# Patient Record
Sex: Male | Born: 1955 | Race: White | Hispanic: No | Smoking: Current every day smoker
Health system: Southern US, Community
[De-identification: ages and names within clinical notes are randomized; demographics above are authoritative.]

## PROBLEM LIST (undated history)

## (undated) DIAGNOSIS — C787 Secondary malignant neoplasm of liver and intrahepatic bile duct: Secondary | ICD-10-CM

## (undated) DIAGNOSIS — K219 Gastro-esophageal reflux disease without esophagitis: Secondary | ICD-10-CM

## (undated) DIAGNOSIS — C801 Malignant (primary) neoplasm, unspecified: Secondary | ICD-10-CM

## (undated) DIAGNOSIS — M109 Gout, unspecified: Secondary | ICD-10-CM

## (undated) DIAGNOSIS — C259 Malignant neoplasm of pancreas, unspecified: Secondary | ICD-10-CM

## (undated) HISTORY — PX: NO PAST SURGERIES: SHX2092

---

## 2002-03-04 ENCOUNTER — Emergency Department (HOSPITAL_COMMUNITY): Admission: EM | Admit: 2002-03-04 | Discharge: 2002-03-04 | Payer: Self-pay | Admitting: Emergency Medicine

## 2002-03-07 ENCOUNTER — Emergency Department (HOSPITAL_COMMUNITY): Admission: EM | Admit: 2002-03-07 | Discharge: 2002-03-07 | Payer: Self-pay | Admitting: Emergency Medicine

## 2005-01-09 ENCOUNTER — Emergency Department (HOSPITAL_COMMUNITY): Admission: EM | Admit: 2005-01-09 | Discharge: 2005-01-09 | Payer: Self-pay | Admitting: Emergency Medicine

## 2005-01-11 ENCOUNTER — Emergency Department (HOSPITAL_COMMUNITY): Admission: EM | Admit: 2005-01-11 | Discharge: 2005-01-11 | Payer: Self-pay | Admitting: Emergency Medicine

## 2011-06-29 ENCOUNTER — Emergency Department (HOSPITAL_COMMUNITY): Payer: Self-pay

## 2011-06-29 ENCOUNTER — Emergency Department (HOSPITAL_COMMUNITY)
Admission: EM | Admit: 2011-06-29 | Discharge: 2011-06-29 | Disposition: A | Discharge status: Home Health | Payer: Self-pay | Attending: Emergency Medicine | Admitting: Emergency Medicine

## 2011-06-29 ENCOUNTER — Encounter (HOSPITAL_COMMUNITY): Payer: Self-pay | Admitting: *Deleted

## 2011-06-29 DIAGNOSIS — S02609A Fracture of mandible, unspecified, initial encounter for closed fracture: Secondary | ICD-10-CM | POA: Insufficient documentation

## 2011-06-29 DIAGNOSIS — R6884 Jaw pain: Secondary | ICD-10-CM | POA: Insufficient documentation

## 2011-06-29 DIAGNOSIS — M25429 Effusion, unspecified elbow: Secondary | ICD-10-CM | POA: Insufficient documentation

## 2011-06-29 DIAGNOSIS — IMO0002 Reserved for concepts with insufficient information to code with codable children: Secondary | ICD-10-CM | POA: Insufficient documentation

## 2011-06-29 DIAGNOSIS — M25569 Pain in unspecified knee: Secondary | ICD-10-CM | POA: Insufficient documentation

## 2011-06-29 DIAGNOSIS — Y9355 Activity, bike riding: Secondary | ICD-10-CM | POA: Insufficient documentation

## 2011-06-29 DIAGNOSIS — S52123A Displaced fracture of head of unspecified radius, initial encounter for closed fracture: Secondary | ICD-10-CM | POA: Insufficient documentation

## 2011-06-29 MED ORDER — OXYCODONE-ACETAMINOPHEN 5-325 MG PO TABS: 2.0000 | ORAL_TABLET | ORAL | Status: AC | PRN

## 2011-06-29 MED ORDER — HYDROCODONE-ACETAMINOPHEN 5-325 MG PO TABS
2.0000 | ORAL_TABLET | Freq: Once | ORAL | Status: AC
  Administered 2011-06-29: 2 via ORAL
  Filled 2011-06-29: qty 2
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
## 2011-06-29 NOTE — ED Notes (Signed)
Dr Rosen at bedside 

## 2011-06-29 NOTE — ED Notes (Signed)
Report given to Jacklyn Shell, RN. Pt. Will be waiting for Dr. Pollyann Kennedy.

## 2011-06-29 NOTE — Progress Notes (Signed)
Orthopedic Tech Progress Note Patient Details:  Chad Arnold 12/23/1955 [id number]  Other Ortho Devices Type of Ortho Device: Other (comment) (arm sling) Ortho Device Location: (R) UE Ortho Device Interventions: Application   Jennye Moccasin 06/29/2011, 4:05 PM
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
## 2012-03-03 ENCOUNTER — Encounter (HOSPITAL_COMMUNITY): Payer: Self-pay | Admitting: Emergency Medicine

## 2012-03-03 ENCOUNTER — Emergency Department (HOSPITAL_COMMUNITY)
Admission: EM | Admit: 2012-03-03 | Discharge: 2012-03-03 | Disposition: A | Discharge status: Home / Self Care | Payer: Self-pay | Attending: Emergency Medicine | Admitting: Emergency Medicine

## 2012-03-03 DIAGNOSIS — F17200 Nicotine dependence, unspecified, uncomplicated: Secondary | ICD-10-CM | POA: Insufficient documentation

## 2012-03-03 DIAGNOSIS — M79609 Pain in unspecified limb: Secondary | ICD-10-CM | POA: Insufficient documentation

## 2012-03-03 DIAGNOSIS — M79606 Pain in leg, unspecified: Secondary | ICD-10-CM

## 2012-03-03 MED ORDER — KETOROLAC TROMETHAMINE 30 MG/ML IJ SOLN
30.0000 mg | Freq: Once | INTRAMUSCULAR | Status: AC
  Administered 2012-03-03: 30 mg via INTRAMUSCULAR
  Filled 2012-03-03: qty 1

## 2012-03-03 MED ORDER — TRAMADOL HCL 50 MG PO TABS: 50.0000 mg | ORAL_TABLET | Freq: Four times a day (QID) | ORAL | Status: DC | PRN
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
## 2012-03-03 NOTE — ED Provider Notes (Signed)
History   This chart was scribed for [name], MD by Gerlean Ren. This patient was seen in room TR10C/TR10C and the patient's care was started at 11:00 PM .   CSN: [id number]  Arrival date & time 03/03/12  2000   First MD Initiated Contact with Patient 03/03/12 2242      Chief Complaint  Patient presents with  . Leg Pain    (Consider location/radiation/quality/duration/timing/severity/associated sxs/prior treatment) The history is provided by the patient. No language interpreter was used.   Chad Arnold is a 56 y.o. male who presents to the Emergency Department complaining of constant, gradually worsening, non-radiating burning right lower extremity pain over front of right thigh with gradual onset after raking yard 4 days ago that is worsened by ambulation and not improved by OCM.  Pt denies any pain over posterior side of right thigh, numbness, and tingling associated.  History reviewed. No pertinent past medical history.  History reviewed. No pertinent past surgical history.  No family history on file.  History  Substance Use Topics  . Smoking status: Current Every Day Smoker  . Smokeless tobacco: Not on file  . Alcohol Use: Yes      Review of Systems  Constitutional:       Per HPI, otherwise negative  HENT:       Per HPI, otherwise negative  Eyes: Negative.   Respiratory:       Per HPI, otherwise negative  Cardiovascular:       Per HPI, otherwise negative  Gastrointestinal: Negative for vomiting.  Genitourinary: Negative.   Musculoskeletal:       Per HPI, otherwise negative Right leg pain.  Skin: Negative.   Neurological: Negative for syncope.    Allergies  Review of patient's allergies indicates no known allergies.  Home Medications  No current outpatient prescriptions on file.  BP 176/94  Pulse 109  Temp 97.5 F (36.4 C) (Oral)  Resp 18  SpO2 97%  Physical Exam  Nursing note and vitals reviewed. Constitutional: He is oriented to  person, place, and time. He appears well-developed. No distress.  HENT:  Head: Normocephalic and atraumatic.  Eyes: Conjunctivae normal and EOM are normal.  Cardiovascular: Normal rate and regular rhythm.        Symmetric distal pulses bilaterally.  Pulmonary/Chest: Effort normal. No stridor. No respiratory distress.  Abdominal: He exhibits no distension.  Musculoskeletal: He exhibits no edema.       Right knee stable. No joint line tenderness medially or laterally.  Patella stable.  Pelvis stable.   Neurological: He is alert and oriented to person, place, and time.  Skin: Skin is warm and dry.  Psychiatric: He has a normal mood and affect.    ED Course  Procedures (including critical care time) DIAGNOSTIC STUDIES: Oxygen Saturation is 97% on room air, adequate by my interpretation.    COORDINATION OF CARE: 11:04PM- Patient informed of clinical course, understands medical decision-making process, and agrees with plan. Ordered IM toradol.     Labs Reviewed - No data to display No results found.   No diagnosis found.    MDM  I personally performed the services described in this documentation, which was scribed in my presence. The recorded information has been reviewed and considered.  This male presents with ongoing right thigh pain.  On exam he is in no distress.  Please stable, and there is no evidence of infection, and little suspicion of more acute pathology such as fasciitis.  Given the patient's  endorsement of significant exertion pertinent for the symptoms are suggestive of muscle strain versus sprain.  [name], MD 03/04/12 [phone]

## 2012-03-21 ENCOUNTER — Encounter (HOSPITAL_COMMUNITY): Payer: Self-pay | Admitting: Emergency Medicine

## 2012-03-21 ENCOUNTER — Emergency Department (HOSPITAL_COMMUNITY)
Admission: EM | Admit: 2012-03-21 | Discharge: 2012-03-21 | Disposition: A | Discharge status: Home / Self Care | Payer: Self-pay | Attending: Emergency Medicine | Admitting: Emergency Medicine

## 2012-03-21 ENCOUNTER — Emergency Department (HOSPITAL_COMMUNITY): Payer: Self-pay

## 2012-03-21 DIAGNOSIS — M5430 Sciatica, unspecified side: Secondary | ICD-10-CM | POA: Insufficient documentation

## 2012-03-21 DIAGNOSIS — F17200 Nicotine dependence, unspecified, uncomplicated: Secondary | ICD-10-CM | POA: Insufficient documentation

## 2012-03-21 MED ORDER — HYDROCODONE-ACETAMINOPHEN 5-325 MG PO TABS: 1.0000 | ORAL_TABLET | Freq: Four times a day (QID) | ORAL | Status: DC | PRN

## 2012-03-21 MED ORDER — NAPROXEN 500 MG PO TBEC: 500.0000 mg | DELAYED_RELEASE_TABLET | Freq: Two times a day (BID) | ORAL | Status: DC

## 2012-03-21 MED ORDER — OXYCODONE-ACETAMINOPHEN 5-325 MG PO TABS
1.0000 | ORAL_TABLET | Freq: Once | ORAL | Status: AC
  Administered 2012-03-21: 1 via ORAL
  Filled 2012-03-21: qty 1

## 2012-03-21 MED ORDER — CYCLOBENZAPRINE HCL 5 MG PO TABS: 5.0000 mg | ORAL_TABLET | Freq: Three times a day (TID) | ORAL | Status: DC | PRN

## 2012-03-21 NOTE — ED Notes (Signed)
Patient transported to X-ray 

## 2012-03-21 NOTE — ED Provider Notes (Signed)
History    CSN: [id number] Arrival date & time 03/21/12  1443 First MD Initiated Contact with Patient 03/21/12 1457   Chief complaint:  Back and leg pain    HPI Pt has been having pain in his lower back and thigh now since the beginning of November.  It started after he had been raking in the yard.  Since that time the pain has persisted and now he is having pain in his lower back.  Initially it was more in the anterior part of his right thigh.  He came to the ED and was given medications but the symptoms have not gotten better and now the back is involved. No incontinence.  No numbness or weakness.  Pain increases with movement including bending over and trying to sit up straight.  History reviewed. No pertinent past medical history.  History reviewed. No pertinent past surgical history.  No family history on file.  History  Substance Use Topics  . Smoking status: Current Every Day Smoker  . Smokeless tobacco: Not on file  . Alcohol Use: Yes      Review of Systems  All other systems reviewed and are negative.    Allergies  Review of patient's allergies indicates no known allergies.  Home Medications   Current Outpatient Rx  Name  Route  Sig  Dispense  Refill  . ASPIRIN 325 MG PO TABS   Oral   Take 650 mg by mouth 2 (two) times daily as needed. For pain         . TRAMADOL HCL 50 MG PO TABS   Oral   Take 1 tablet (50 mg total) by mouth every 6 (six) hours as needed for pain.   15 tablet   0     BP 140/78  Pulse 82  Temp 97.5 F (36.4 C)  Resp 16  SpO2 99%  Physical Exam  Nursing note and vitals reviewed. Constitutional: He appears well-developed and well-nourished. No distress.  HENT:  Head: Normocephalic and atraumatic.  Right Ear: External ear normal.  Left Ear: External ear normal.  Nose: Nose normal.  Eyes: Conjunctivae normal and EOM are normal. Right eye exhibits no discharge. Left eye exhibits no discharge. No scleral icterus.  Neck: Neck  supple. No tracheal deviation present.  Cardiovascular: Normal rate.   Pulmonary/Chest: Effort normal. No stridor. No respiratory distress.  Musculoskeletal: He exhibits tenderness. He exhibits no edema.       Lumbar back: He exhibits decreased range of motion, tenderness, pain and spasm. He exhibits no swelling and no edema.       ttp anterior thigh,  Neurological: He is alert. He is not disoriented. No sensory deficit. Cranial nerve deficit: no gross deficits. He exhibits normal muscle tone. Coordination normal.  Reflex Scores:      Patellar reflexes are 2+ on the right side and 2+ on the left side.      Achilles reflexes are 2+ on the right side and 2+ on the left side. Skin: Skin is warm and dry. No rash noted. He is not diaphoretic. No erythema.  Psychiatric: He has a normal mood and affect. His behavior is normal. Thought content normal.    ED Course  Procedures (including critical care time)  Labs Reviewed - No data to display Dg Lumbar Spine Complete  03/21/2012  *RADIOLOGY REPORT*  Clinical Data: Pain in low back radiates to head on the right  LUMBAR SPINE - COMPLETE 4+ VIEW  Comparison: None.  Findings: Bones are  demineralized.  No evidence for compression fracture.  Loss of disc height is seen at L5-S1.  Convex rightward lumbar scoliosis is evident with apex at L3 level.  SI joints are normal.  IMPRESSION: No acute bony findings.   Original Report Authenticated By: Kennith Center, M.D.      1. Sciatica       MDM  No sign of acute neurological or vascular emergency associated with pt's back pain.  May have a component of sciatica.  Safe for outpatient follow up.         Celene Kras, MD 03/21/12 [phone]

## 2012-03-21 NOTE — ED Notes (Signed)
Still having back pain was sen here on 11/4 for same

## 2012-03-21 NOTE — ED Notes (Signed)
Patient is alert and orientedx4.  Patient was explained discharge instructions and he understood them with no questions. Patient's nephew is coming to transport him home.
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
## 2013-10-01 IMAGING — CR DG LUMBAR SPINE COMPLETE 4+V
5 series · 5 of 5 positions shown · non-contrast
Comparison: None.

CLINICAL DATA: Pain in low back radiates to head on the right

LUMBAR SPINE - COMPLETE 4+ VIEW

[t l-spine a.p.]
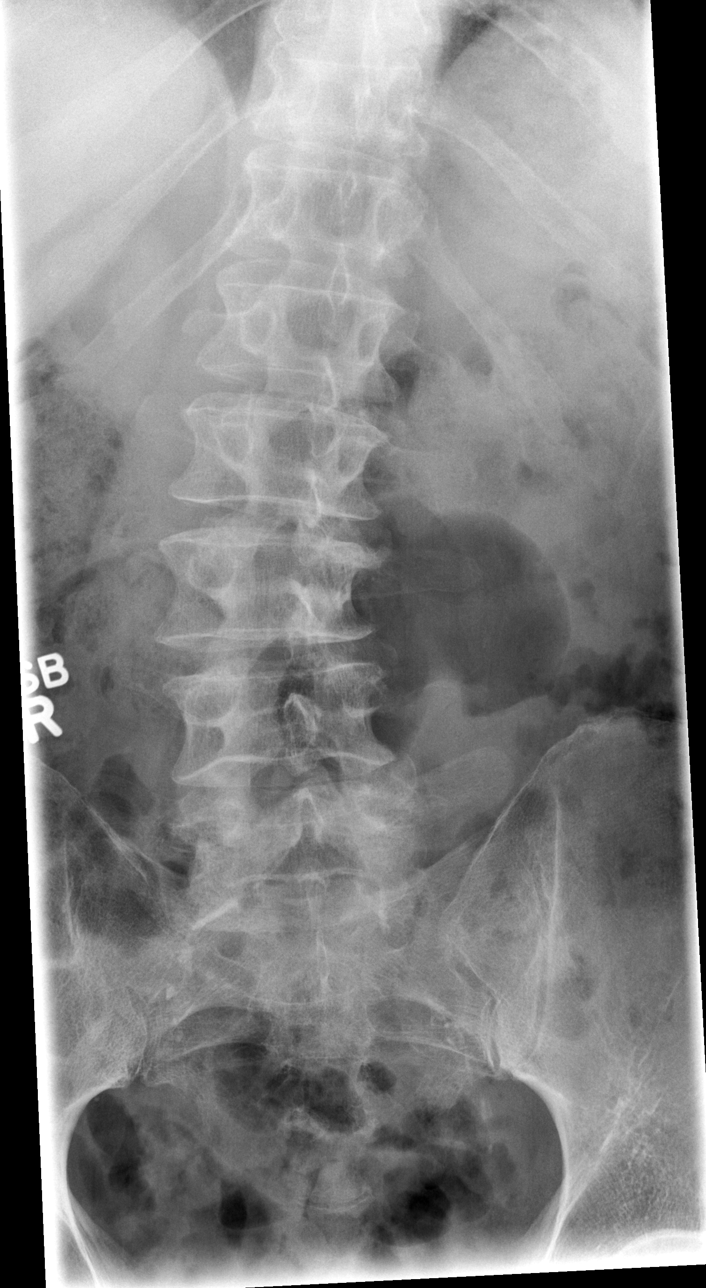

[t l-spine oblique exposure (1 of 2)]
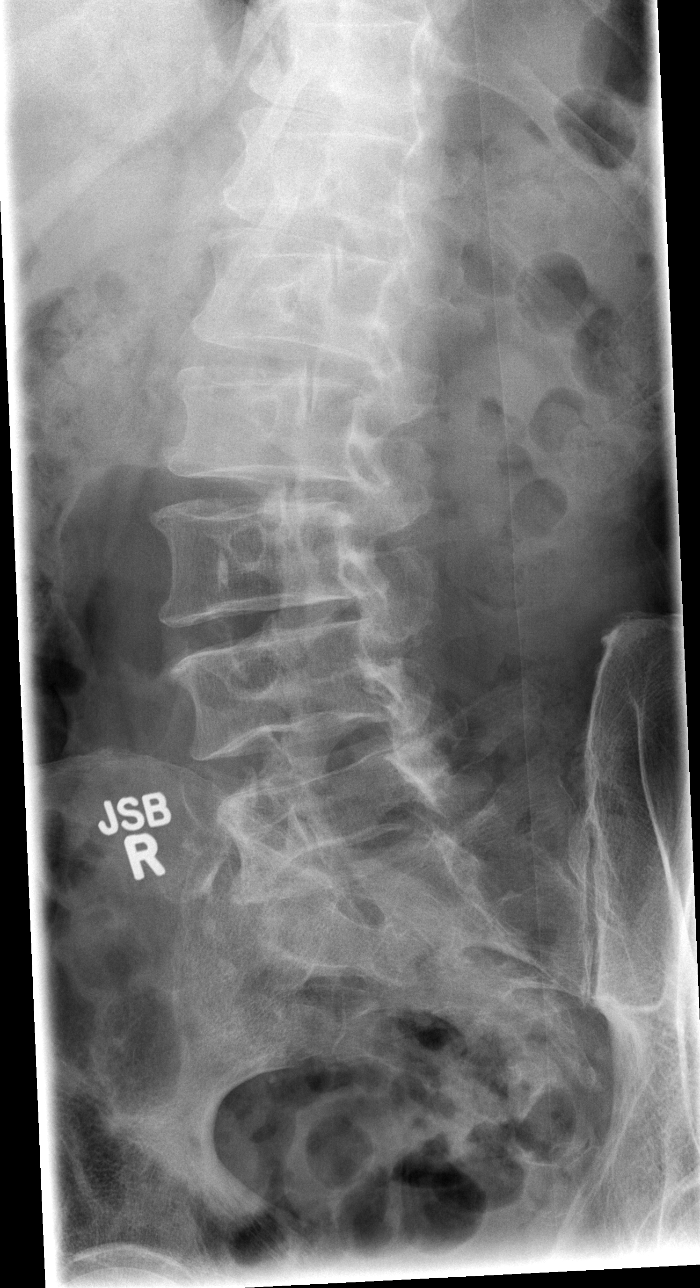

[t l-spine oblique exposure (2 of 2)]
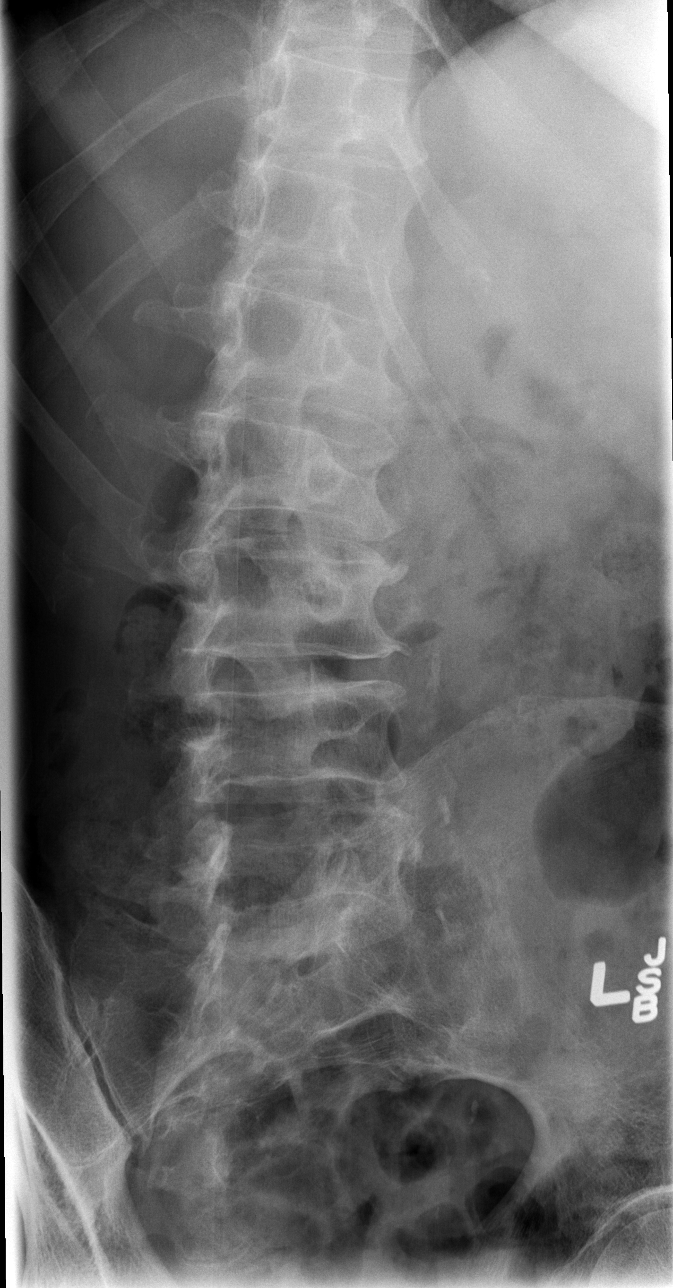

[t l-spine lat]
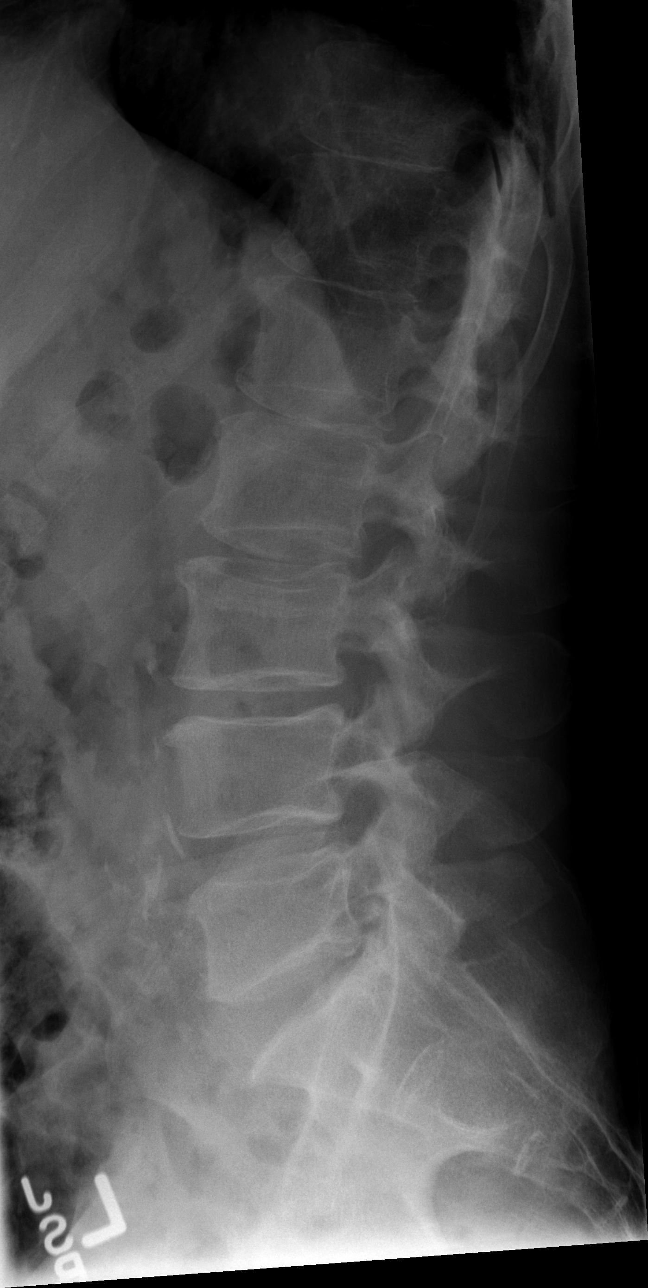

[t l-spine l5-s1 spot]
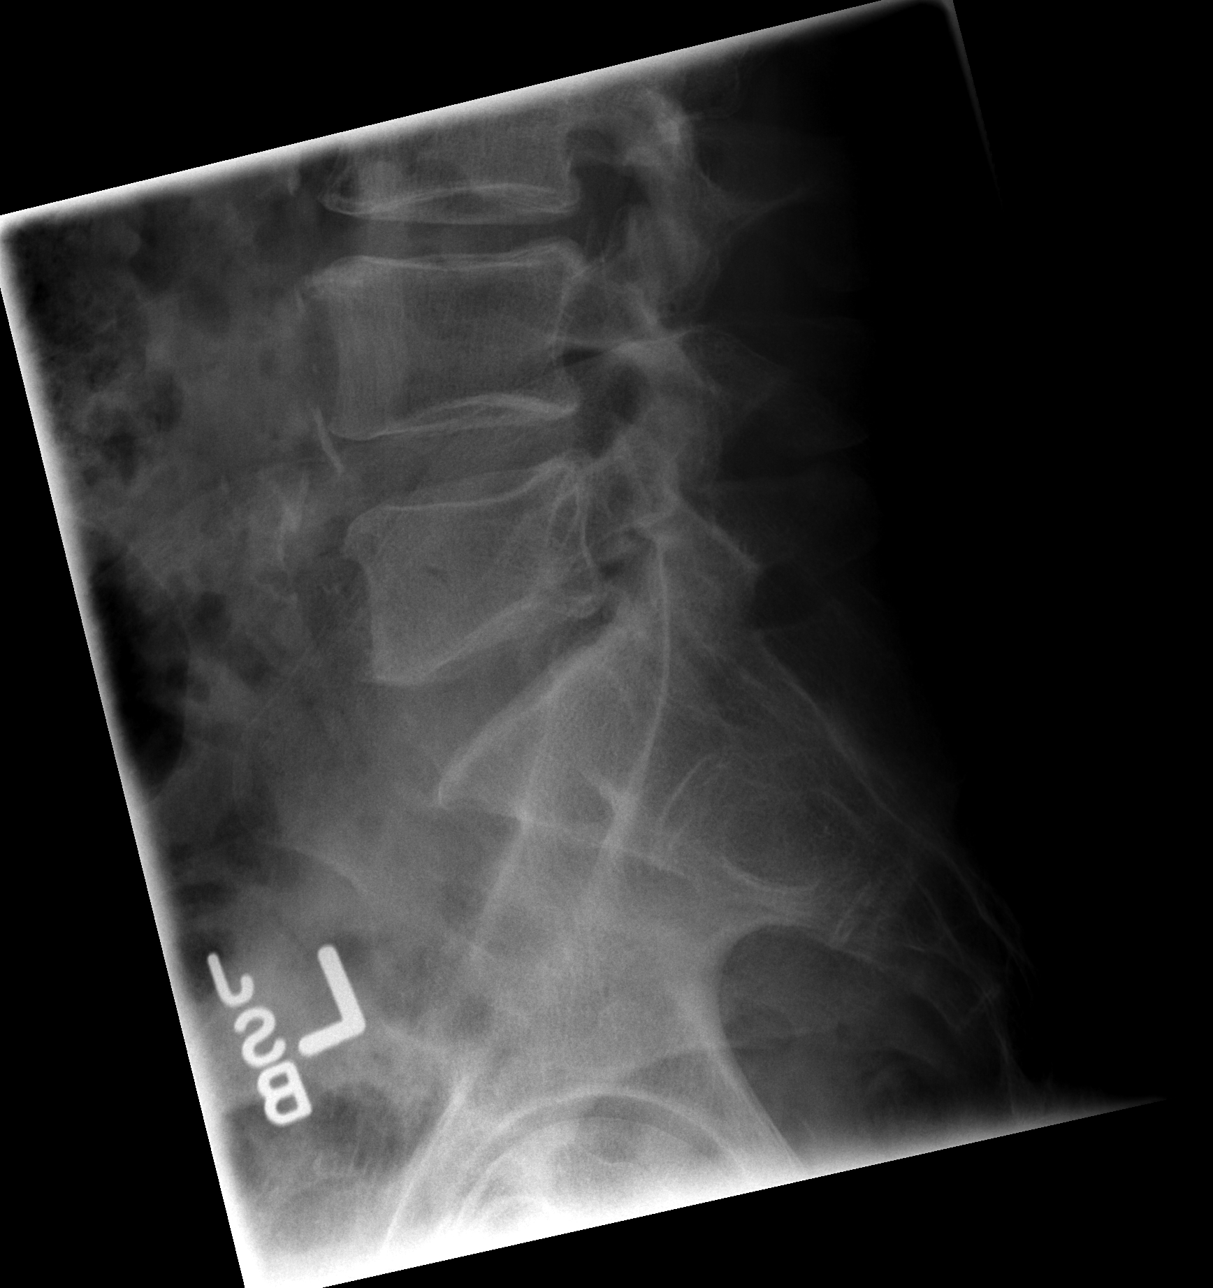

[5 of 5 positions shown; findings below may reference images not displayed]

FINDINGS: Bones are demineralized.  No evidence for compression
fracture.  Loss of disc height is seen at L5-S1.  Convex rightward
lumbar scoliosis is evident with apex at L3 level.  SI joints are
normal.
IMPRESSION: No acute bony findings.

## 2015-09-27 ENCOUNTER — Encounter (HOSPITAL_COMMUNITY): Payer: Self-pay | Admitting: Emergency Medicine

## 2015-09-27 ENCOUNTER — Emergency Department (HOSPITAL_COMMUNITY)
Admission: EM | Admit: 2015-09-27 | Discharge: 2015-09-28 | Disposition: A | Discharge status: Home / Self Care | Payer: Self-pay | Attending: Emergency Medicine | Admitting: Emergency Medicine

## 2015-09-27 DIAGNOSIS — T310 Burns involving less than 10% of body surface: Secondary | ICD-10-CM | POA: Insufficient documentation

## 2015-09-27 DIAGNOSIS — T24232A Burn of second degree of left lower leg, initial encounter: Secondary | ICD-10-CM | POA: Insufficient documentation

## 2015-09-27 DIAGNOSIS — Z79899 Other long term (current) drug therapy: Secondary | ICD-10-CM | POA: Insufficient documentation

## 2015-09-27 DIAGNOSIS — Z7982 Long term (current) use of aspirin: Secondary | ICD-10-CM | POA: Insufficient documentation

## 2015-09-27 DIAGNOSIS — Y939 Activity, unspecified: Secondary | ICD-10-CM | POA: Insufficient documentation

## 2015-09-27 DIAGNOSIS — Y92009 Unspecified place in unspecified non-institutional (private) residence as the place of occurrence of the external cause: Secondary | ICD-10-CM | POA: Insufficient documentation

## 2015-09-27 DIAGNOSIS — X088XXA Exposure to other specified smoke, fire and flames, initial encounter: Secondary | ICD-10-CM | POA: Insufficient documentation

## 2015-09-27 DIAGNOSIS — F17200 Nicotine dependence, unspecified, uncomplicated: Secondary | ICD-10-CM | POA: Insufficient documentation

## 2015-09-27 DIAGNOSIS — Z23 Encounter for immunization: Secondary | ICD-10-CM | POA: Insufficient documentation

## 2015-09-27 DIAGNOSIS — T24202A Burn of second degree of unspecified site of left lower limb, except ankle and foot, initial encounter: Secondary | ICD-10-CM

## 2015-09-27 DIAGNOSIS — Y999 Unspecified external cause status: Secondary | ICD-10-CM | POA: Insufficient documentation

## 2015-09-27 MED ORDER — TETANUS-DIPHTH-ACELL PERTUSSIS 5-2.5-18.5 LF-MCG/0.5 IM SUSP
0.5000 mL | Freq: Once | INTRAMUSCULAR | Status: AC
  Administered 2015-09-28: 0.5 mL via INTRAMUSCULAR
  Filled 2015-09-27: qty 0.5
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
## 2015-09-28 MED ORDER — BACITRACIN 500 UNIT/GM EX OINT
1.0000 "application " | TOPICAL_OINTMENT | Freq: Two times a day (BID) | CUTANEOUS | Status: DC
  Administered 2015-09-28: 1 via TOPICAL
  Filled 2015-09-28: qty 1.8
  Filled 2015-09-28: qty 0.9

## 2015-09-28 MED ORDER — BACITRACIN ZINC 500 UNIT/GM EX OINT: 1.0000 "application " | TOPICAL_OINTMENT | Freq: Two times a day (BID) | CUTANEOUS | Status: DC

## 2015-09-28 MED ORDER — NAPROXEN 250 MG PO TABS: 250.0000 mg | ORAL_TABLET | Freq: Two times a day (BID) | ORAL | Status: DC

## 2015-09-28 NOTE — Discharge Instructions (Signed)
Second-Degree Burn A second-degree burn affects the 2 outer layers of skin. The outer layer (epidermis) and the layer underneath it (dermis) are both burned. Another name for this type of burn is a partial thickness burn. A second-degree burn may be called minor or major. This depends on the size of the burn. It also depends on what parts of the skin are burned. Minor burns may be treated with first aid. Major burns are a medical emergency. A second-degree burn is worse than a first-degree burn, but not as bad as a third-degree burn. A first-degree burn affects only the epidermis. A third-degree burn goes through all the layers of skin. A second-degree burn usually heals in 3 to 4 weeks. A minor second-degree burn usually does not leave a scar.Deeper second-degree burns may lead to scarring of the skin or contractures over joints.Contractures are scars that form over joints and may lead to reduced mobility at those joints. CAUSES  Heat (thermal) injury. This happens when skin comes in contact with something very hot. It could be a flame, a hot object, hot liquid, or steam. Most second-degree burns are thermal injuries.  Radiation. Sunlight is one type of radiation that can burn the skin. Another type of radiation is used to heat food. Radiation is also used to treat some diseases, such as cancer. All types of radiation can burn the skin. Sunlight usually causes a first-degree burn. Radiation used for heating food or treating a disease can cause a second-degree burn.  Electricity. Electrical burns can cause more damage under the skin than on the surface. They should always be treated as major burns.  Chemicals. Many chemicals can burn the skin. The burn should be flushed with cool water and checked by an emergency caregiver. SYMPTOMS Symptoms of second-degree burns include:  Severe pain.  Extreme tenderness.  Deep redness.  Blistered skin.  Skin that has changed color.It might look blotchy,  wet, or shiny.  Swelling. TREATMENT Some second-degree burns may need to be treated in a hospital. These include major burns, electrical burns, and chemical burns. Many other second-degree burns can be treated with regular first aid, such as:  Cooling the burn. Use cool, germ-free (sterile) salt water. Place the burned area of skin into a tub of water, or cover the burned area with clean, wet towels.  Taking pain medicine.  Removing the dead skin from broken blisters. A trained caregiver may do this. Do not pop blisters.  Gently washing your skin with mild soap.  Covering the burned area with a cream.Silver sulfadiazine is a cream for burns. An antibiotic cream, such as bacitracin, may also be used to fight infection. Do not use other ointments or creams unless your caregiver says it is okay.  Protecting the burn with a sterile, non-sticky bandage.  Bandaging fingers and toes separately. This keeps them from sticking together.  Taking an antibiotic. This can help prevent infection.  Getting a tetanus shot. HOME CARE INSTRUCTIONS Medication  Take any medicine prescribed by your caregiver. Follow the directions carefully.  Ask your caregiver if you can take over-the-counter medicine to relieve pain and swelling. Do not give aspirin to children.  Make sure your caregiver knows about all other medicines you take.This includes over-the-counter medicines. Burn care  You will need to change the bandage on your burn. You may need to do this 2 or 3 times each day.  Gently clean the burned area.  Put ointment on it.  Cover the burn with a sterile bandage.  For some deeper burns or burns that cover a large area, compression garments may be prescribed. These garments can help minimize scarring and protect your mobility.  Do not put butter or oil on your skin. Use only the cream prescribed by your caregiver.  Do not put ice on your burn.  Do not break blisters on your  skin.  Keep the bandaged area dry. You might need to take a sponge bath for awhile.Ask your caregiver when you can take a shower or a tub bath again.  Do not scratch an itchy burn. Your caregiver may give you medicine to relieve very bad itching.  Infection is a big danger after a second-degree burn. Tell your caregiver right away if you have signs of infection, such as:  Redness or changing color in the burned area.  Fluid leaking from the burn.  Swelling in the burn area.  A bad smell coming from the wound. Follow-up  Keep all follow-up appointments.This is important. This is how your caregiver can tell if your treatment is working.  Protect your burn from sunlight.Use sunscreen whenever you go outside.Burned areas may be sensitive to the sun for up to 1 year. Exposure to the sun may also cause permanent darkening of scars. SEEK MEDICAL CARE IF:  You have any questions about medicines.  You have any questions about your treatment.  You wonder if it is okay to do a particular activity.  You develop a fever of more than 100.5 F (38.1 C). SEEK IMMEDIATE MEDICAL CARE IF:  You think your burn might be infected. It may change color, become red, leak fluid, swell, or smell bad.  You develop a fever of more than 102 F (38.9 C).   This information is not intended to replace advice given to you by your health care provider. Make sure you discuss any questions you have with your health care provider.   Document Released: 09/18/2010 Document Revised: 07/09/2011 Document Reviewed: 09/18/2010 Elsevier Interactive Patient Education 2016 [employer] Your skin is a natural barrier to infection. It is the largest organ of your body. Burns damage this natural protection. To help prevent infection, it is very important to follow your caregiver's instructions in the care of your burn. Burns are classified as:  First degree. There is only redness of the skin (erythema).  No scarring is expected.  Second degree. There is blistering of the skin. Scarring may occur with deeper burns.  Third degree. All layers of the skin are injured, and scarring is expected. HOME CARE INSTRUCTIONS   Wash your hands well before changing your bandage.  Change your bandage as often as directed by your caregiver.  Remove the old bandage. If the bandage sticks, you may soak it off with cool, clean water.  Cleanse the burn thoroughly but gently with mild soap and water.  Pat the area dry with a clean, dry cloth.  Apply a thin layer of antibacterial cream to the burn.  Apply a clean bandage as instructed by your caregiver.  Keep the bandage as clean and dry as possible.  Elevate the affected area for the first 24 hours, then as instructed by your caregiver.  Only take over-the-counter or prescription medicines for pain, discomfort, or fever as directed by your caregiver. SEEK IMMEDIATE MEDICAL CARE IF:   You develop excessive pain.  You develop redness, tenderness, swelling, or red streaks near the burn.  The burned area develops yellowish-white fluid (pus) or a bad smell.  You  have a fever. MAKE SURE YOU:   Understand these instructions.  Will watch your condition.  Will get help right away if you are not doing well or get worse.   This information is not intended to replace advice given to you by your health care provider. Make sure you discuss any questions you have with your health care provider.   Document Released: 04/16/2005 Document Revised: 07/09/2011 Document Reviewed: 09/06/2010 Elsevier Interactive Patient Education [employer].

## 2016-02-10 ENCOUNTER — Encounter (HOSPITAL_COMMUNITY): Payer: Self-pay | Admitting: Emergency Medicine

## 2016-02-10 ENCOUNTER — Emergency Department (HOSPITAL_COMMUNITY)
Admission: EM | Admit: 2016-02-10 | Discharge: 2016-02-11 | Disposition: A | Discharge status: Home / Self Care | Payer: Medicaid Other | Attending: Emergency Medicine | Admitting: Emergency Medicine

## 2016-02-10 ENCOUNTER — Emergency Department (HOSPITAL_COMMUNITY): Payer: Medicaid Other

## 2016-02-10 DIAGNOSIS — Z791 Long term (current) use of non-steroidal anti-inflammatories (NSAID): Secondary | ICD-10-CM | POA: Diagnosis not present

## 2016-02-10 DIAGNOSIS — R1084 Generalized abdominal pain: Secondary | ICD-10-CM | POA: Insufficient documentation

## 2016-02-10 DIAGNOSIS — R109 Unspecified abdominal pain: Secondary | ICD-10-CM | POA: Diagnosis present

## 2016-02-10 DIAGNOSIS — F17200 Nicotine dependence, unspecified, uncomplicated: Secondary | ICD-10-CM | POA: Insufficient documentation

## 2016-02-10 HISTORY — DX: Gastro-esophageal reflux disease without esophagitis: K21.9

## 2016-02-10 LAB — URINALYSIS, ROUTINE W REFLEX MICROSCOPIC
BILIRUBIN URINE: NEGATIVE
GLUCOSE, UA: NEGATIVE mg/dL
HGB URINE DIPSTICK: NEGATIVE
Ketones, ur: NEGATIVE mg/dL
Leukocytes, UA: NEGATIVE
NITRITE: NEGATIVE
PH: 6.5 (ref 5.0–8.0)
Protein, ur: NEGATIVE mg/dL
SPECIFIC GRAVITY, URINE: 1.012 (ref 1.005–1.030)

## 2016-02-10 LAB — COMPREHENSIVE METABOLIC PANEL
ALBUMIN: 4.2 g/dL (ref 3.5–5.0)
ALT: 20 U/L (ref 17–63)
ANION GAP: 10 (ref 5–15)
AST: 25 U/L (ref 15–41)
Alkaline Phosphatase: 76 U/L (ref 38–126)
BUN: 9 mg/dL (ref 6–20)
CHLORIDE: 96 mmol/L — AB (ref 101–111)
CO2: 25 mmol/L (ref 22–32)
Calcium: 9.6 mg/dL (ref 8.9–10.3)
Creatinine, Ser: 0.73 mg/dL (ref 0.61–1.24)
GFR calc Af Amer: >60 mL/min (ref >60)
GFR calc non Af Amer: >60 mL/min (ref >60)
GLUCOSE: 95 mg/dL (ref 65–99)
POTASSIUM: 4.4 mmol/L (ref 3.5–5.1)
SODIUM: 131 mmol/L — AB (ref 135–145)
Total Bilirubin: 0.6 mg/dL (ref 0.3–1.2)
Total Protein: 7.8 g/dL (ref 6.5–8.1)

## 2016-02-10 LAB — CBC
HEMATOCRIT: 42.4 % (ref 39.0–52.0)
HEMOGLOBIN: 14.8 g/dL (ref 13.0–17.0)
MCH: 34.4 pg — AB (ref 26.0–34.0)
MCHC: 34.9 g/dL (ref 30.0–36.0)
MCV: 98.6 fL (ref 78.0–100.0)
Platelets: 217 10*3/uL (ref 150–400)
RBC: 4.3 MIL/uL (ref 4.22–5.81)
RDW: 13.5 % (ref 11.5–15.5)
WBC: 8.2 10*3/uL (ref 4.0–10.5)

## 2016-02-10 LAB — LIPASE, BLOOD: Lipase: 77 U/L — ABNORMAL HIGH (ref 11–51)

## 2016-02-10 MED ORDER — IOPAMIDOL (ISOVUE-300) INJECTION 61%
100.0000 mL | Freq: Once | INTRAVENOUS | Status: AC | PRN
  Administered 2016-02-10: 100 mL via INTRAVENOUS

## 2016-02-10 MED ORDER — MORPHINE SULFATE (PF) 2 MG/ML IV SOLN
2.0000 mg | Freq: Once | INTRAVENOUS | Status: AC
  Administered 2016-02-10: 2 mg via INTRAVENOUS
  Filled 2016-02-10: qty 1

## 2016-02-10 MED ORDER — MORPHINE SULFATE (PF) 4 MG/ML IV SOLN
4.0000 mg | Freq: Once | INTRAVENOUS | Status: AC
  Administered 2016-02-10: 4 mg via INTRAVENOUS
  Filled 2016-02-10: qty 1

## 2016-02-10 NOTE — ED Provider Notes (Signed)
9:00 PM: Assumed care from [facility], PA-C.   Chad Arnold is a 60 y.o. male with h/o GERD presents to ED with several months history of intermittent episodes of abdominal pain that migrate to different areas of abdomen. Sxs transiently improve with ibuprofen and prilosec. He is able to eat and drink.  Denies fever, bloody/dark stools, N/V. CBC and CMP re-assuring. Mild elevation in lipase. At sign out, CT scan pending. Pending normal CT scan can d/c with OP follow up ad symptomatic management.   11:15 PM: On re-evaluation, patient endorses pain is 8/10; however, states it is improving in that "I can now sit up." He is non-toxic appearing in NAD. He is diffusely tender on abdominal exam with mild guarding. Will redose pain medicines. 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 Neoplastic cyst; recommend surgery consult. Of incidental note, enlarged prostate with calcifications and fatty liver. Consult to general surgery placed.   11:36 PM: Spoke with Dr. Marlou Starks of General Surgery, greatly appreciate his time and input. Recommended OP follow up with oncology surgery.   Discussed results and plan with patient. Suspect pain may be secondary to pancreatic cystic mass. Stressed importance of close follow-up. On re-evaluation patient endorses improvement in pain to 5/10. Abdomen is soft and less tender on exam with no guarding. Patient tolerating PO fluids and crackers. Vital signs remain stable, feel patient is safe for discharge and management outpatient. Discussed continued use of ibuprofen and prilosec. Rx zofran and vicodin for severe pain. Review of [place] controlled substance database shows no recent narcotic rx. Follow up with General Surgery on Monday. Return precautions discussed. Patient voiced understanding and is agreeable.    Roxanna Mew, [place] 02/11/16 [id number]    Charlesetta Shanks, MD 02/11/16 [phone]

## 2016-02-10 NOTE — ED Triage Notes (Signed)
Per pt, states lower/mid abdominal pain for 2 months-states pepcid not woking-states increased belching

## 2016-02-10 NOTE — ED Provider Notes (Signed)
[place] DEPT Provider Note   CSN: [id number] Arrival date & time: 02/10/16  1355     History   Chief Complaint Chief Complaint  Patient presents with  . Abdominal Pain    HPI Chad Arnold is a 60 y.o. male.  HPI   60 year old male presents today with complaints of abdominal pain. Patient reports for the last several months he's had intermittent episodes of abdominal pain in different areas of his abdomen. He reports they migrate, or severe nature. He reports they are there every day, improved with ibuprofen and Prilosec. Patient notes that ibuprofen does allow him to eat and drink, but after wears off he has significant pain. Patient denies any bloody or dark stools. Denies any history of the same. Denies any surgical history. Patient reports he is a smoker, and daily drinker. Patient denies any fever or weight loss.  Past Medical History:  Diagnosis Date  . GERD (gastroesophageal reflux disease)     There are no active problems to display for this patient.   No past surgical history on file.    Home Medications    Prior to Admission medications   Medication Sig Start Date End Date Taking? Authorizing Provider  acetaminophen (TYLENOL) 500 MG tablet Take 1,000 mg by mouth daily as needed for moderate pain.   Yes Historical Provider, MD  ibuprofen (ADVIL,MOTRIN) 200 MG tablet Take 400 mg by mouth daily as needed for moderate pain.   Yes Historical Provider, MD  omeprazole (PRILOSEC OTC) 20 MG tablet Take 20 mg by mouth daily.   Yes Historical Provider, MD  bacitracin ointment Apply 1 application topically 2 (two) times daily. Patient not taking: Reported on 02/10/2016 09/28/15   Waynetta Pean, PA-C  cyclobenzaprine (FLEXERIL) 5 MG tablet Take 1 tablet (5 mg total) by mouth 3 (three) times daily as needed for muscle spasms. Patient not taking: Reported on 02/10/2016 03/21/12   Dorie Rank, MD  HYDROcodone-acetaminophen [facility]) 5-325 MG per tablet Take 1-2 tablets by  mouth every 6 (six) hours as needed for pain. Patient not taking: Reported on 02/10/2016 03/21/12   Dorie Rank, MD  naproxen (NAPROSYN) 250 MG tablet Take 1 tablet (250 mg total) by mouth 2 (two) times daily with a meal. Patient not taking: Reported on 02/10/2016 09/28/15   Waynetta Pean, PA-C    Family History Family History  Problem Relation Age of Onset  . Diabetes Sister   . Stroke Other   . Cancer Other     Social History Social History  Substance Use Topics  . Smoking status: Current Every Day Smoker  . Smokeless tobacco: Not on file  . Alcohol use Yes     Allergies   Review of patient's allergies indicates no known allergies.   Review of Systems Review of Systems  All other systems reviewed and are negative.   Physical Exam Updated Vital Signs BP (!) 157/102   Pulse 92   Temp 98.3 F (36.8 C) (Oral)   Resp 20   Wt 53.5 kg   SpO2 98%   Physical Exam  Constitutional: He is oriented to person, place, and time. He appears well-developed and well-nourished.  HENT:  Head: Normocephalic and atraumatic.  Eyes: Conjunctivae are normal. Pupils are equal, round, and reactive to light. Right eye exhibits no discharge. Left eye exhibits no discharge. No scleral icterus.  Neck: Normal range of motion. No JVD present. No tracheal deviation present.  Pulmonary/Chest: Effort normal. No stridor.  Abdominal:  Right/left upper quadrant pain,  right lower quadrant TTP  Neurological: He is alert and oriented to person, place, and time. Coordination normal.  Psychiatric: He has a normal mood and affect. His behavior is normal. Judgment and thought content normal.  Nursing note and vitals reviewed.    ED Treatments / Results  Labs (all labs ordered are listed, but only abnormal results are displayed) Labs Reviewed  LIPASE, BLOOD - Abnormal; Notable for the following:       Result Value   Lipase 77 (*)    All other components within normal limits  COMPREHENSIVE METABOLIC  PANEL - Abnormal; Notable for the following:    Sodium 131 (*)    Chloride 96 (*)    All other components within normal limits  CBC - Abnormal; Notable for the following:    MCH 34.4 (*)    All other components within normal limits  URINALYSIS, ROUTINE W REFLEX MICROSCOPIC (NOT AT [facility])    EKG  EKG Interpretation None       Radiology No results found.  Procedures Procedures (including critical care time)  Medications Ordered in ED Medications  morphine 4 MG/ML injection 4 mg (not administered)  iopamidol (ISOVUE-300) 61 % injection 100 mL (100 mLs Intravenous Contrast Given 02/10/16 2108)     Initial Impression / Assessment and Plan / ED Course  I have reviewed the triage vital signs and the nursing notes.  Pertinent labs & imaging results that were available during my care of the patient were reviewed by me and considered in my medical decision making (see chart for details).  Clinical Course    Final Clinical Impressions(s) / ED Diagnoses   Final diagnoses:  Generalized abdominal pain   Labs: Lipase, CMP, CBC, urinalysis  Imaging: CT abdomen and pelvis  Consults:  Therapeutics:  Discharge Meds:   Assessment/Plan:  60 year old male presents today with abdominal pain. Patient reports 2 months symptoms, worsening yesterday. Patient acutely tender on my exam, slightly elevated lipase. Patient having bowel movements, he will require CT abdomen pelvis here in the ED for further evaluation. He is afebrile nontoxic.  Pt care signed out to oncoming provider pending Ct evaluation and disposition.       New Prescriptions New Prescriptions   No medications on file     Okey Regal, PA-C 02/10/16 2126    Isla Pence, MD 02/11/16 1919

## 2016-02-11 MED ORDER — HYDROCODONE-ACETAMINOPHEN 5-325 MG PO TABS
1.0000 | ORAL_TABLET | Freq: Once | ORAL | Status: AC
  Administered 2016-02-11: 1 via ORAL
  Filled 2016-02-11: qty 1

## 2016-02-11 MED ORDER — HYDROCODONE-ACETAMINOPHEN 5-325 MG PO TABS: 1.0000 | ORAL_TABLET | ORAL | 0 refills | Status: DC | PRN

## 2016-02-11 MED ORDER — ONDANSETRON 4 MG PO TBDP: 4.0000 mg | ORAL_TABLET | Freq: Three times a day (TID) | ORAL | 0 refills | Status: DC | PRN

## 2016-02-11 NOTE — Discharge Instructions (Signed)
Read the information below.  Your imaging was remarkable for a 4.6cm cystic mass in the tail of the pancreas - this could be benign or possibly cancerous. Of note, your prostate appears slightly enlarged on imaging, be sure to follow up with a primary provider. You also have a moderate amount of stool in your colon. You can take miralax for relief of constipation; eat a diet high in fiber.   It is very important that you follow up with General Surgery for further management. I have provided the contact information above. Please call first thing on Monday morning.  It is important that you stay hydrated. Continue prilosec for treatment of GERD. You can continue ibuprofen 400 mg every 6hrs if provides relief.  I have prescribed zofran for nausea, take as needed. I have prescribed vicodin for severe pain. This can make you drowsy, do not drive after taking.  Use the prescribed medication as directed.  Please discuss all new medications with your pharmacist.   You may return to the Emergency Department at any time for worsening condition or any new symptoms that concern you. Return to ED if you develop fever, worsening/uncontrolled abdominal pain, unable to keep food/fluids down, or have blood in stool.

## 2016-03-02 ENCOUNTER — Telehealth: Payer: Self-pay | Admitting: Gastroenterology

## 2016-03-02 NOTE — Telephone Encounter (Signed)
Received referral for pt to sch eus. Referral given to [facility] for review

## 2016-03-13 ENCOUNTER — Encounter: Payer: Self-pay | Admitting: Gastroenterology

## 2016-03-20 ENCOUNTER — Encounter (INDEPENDENT_AMBULATORY_CARE_PROVIDER_SITE_OTHER): Payer: Self-pay

## 2016-03-20 ENCOUNTER — Ambulatory Visit (INDEPENDENT_AMBULATORY_CARE_PROVIDER_SITE_OTHER): Payer: Self-pay | Admitting: Gastroenterology

## 2016-03-20 ENCOUNTER — Encounter: Payer: Self-pay | Admitting: Gastroenterology

## 2016-03-20 VITALS — BP 110/80 | HR 100 | Ht 71.0 in | Wt 148.6 lb

## 2016-03-20 DIAGNOSIS — R1013 Epigastric pain: Secondary | ICD-10-CM

## 2016-03-20 DIAGNOSIS — R948 Abnormal results of function studies of other organs and systems: Secondary | ICD-10-CM | POA: Insufficient documentation

## 2016-03-20 DIAGNOSIS — K869 Disease of pancreas, unspecified: Secondary | ICD-10-CM

## 2016-03-20 DIAGNOSIS — K8689 Other specified diseases of pancreas: Secondary | ICD-10-CM | POA: Insufficient documentation

## 2016-03-20 NOTE — Patient Instructions (Signed)
You have been scheduled for an EUS with Dr Ardis Hughs, instructions have been provided to you.

## 2016-03-20 NOTE — Progress Notes (Signed)
03/20/2016 ATA ABBITT [id number] [birth year]   HISTORY OF PRESENT ILLNESS:  This is a 60 year old male with epigastric/upper abdominal pain and CT scan showing inflammatory changes around the tail the pancreas as well as a 4.6 cm cystic mass with worrisome features. Has loss of appetite, nausea, and weight loss as well.  Was referred her by Dr. Barry Dienes to discuss/schedule EUS.  The patient tells me that on October 13 presented to the emergency department with complaints of abdominal pain. His lipase was slightly elevated at 77 and sodium was low at 131, but otherwise CBC and CMP were unremarkable. He was sent to see Dr. Barry Dienes who then in turn referred him here. He says that since his ER visit he's had persistent pain that has not resolved at all and has been an 8 out of 10 at times. It is interfering with him being able to sleep. He tells me that he has lost weight and used to weigh in the 170s. He does drink alcohol and admits that he has been trying to cut back it is still drinking some beer.   Past Medical History:  Diagnosis Date  . GERD (gastroesophageal reflux disease)    History reviewed. No pertinent surgical history.  reports that he has been smoking.  He has never used smokeless tobacco. He reports that he drinks alcohol. He reports that he does not use drugs. family history includes Breast cancer in his sister; Cancer in his other; Diabetes in his mother and sister; Heart disease in his mother; Stroke in his other; Throat cancer in his father. No Known Allergies    Outpatient Encounter Prescriptions as of 03/20/2016  Medication Sig  . acetaminophen (TYLENOL) 500 MG tablet Take 1,000 mg by mouth daily as needed for moderate pain.  Marland Kitchen HYDROcodone-acetaminophen (NORCO) 5-325 MG tablet Take 1 tablet by mouth every 4 (four) hours as needed.  Marland Kitchen ibuprofen (ADVIL,MOTRIN) 200 MG tablet Take 400 mg by mouth daily as needed for moderate pain.  Marland Kitchen omeprazole (PRILOSEC OTC) 20 MG tablet  Take 20 mg by mouth daily.  . bacitracin ointment Apply 1 application topically 2 (two) times daily. (Patient not taking: Reported on 03/20/2016)  . cyclobenzaprine (FLEXERIL) 5 MG tablet Take 1 tablet (5 mg total) by mouth 3 (three) times daily as needed for muscle spasms. (Patient not taking: Reported on 03/20/2016)  . naproxen (NAPROSYN) 250 MG tablet Take 1 tablet (250 mg total) by mouth 2 (two) times daily with a meal. (Patient not taking: Reported on 03/20/2016)  . [DISCONTINUED] ondansetron (ZOFRAN ODT) 4 MG disintegrating tablet Take 1 tablet (4 mg total) by mouth every 8 (eight) hours as needed for nausea or vomiting.   No facility-administered encounter medications on file as of 03/20/2016.      REVIEW OF SYSTEMS  : All other systems reviewed and negative except where noted in the History of Present Illness.   PHYSICAL EXAM: BP 110/80 (BP Location: Right Arm, Patient Position: Sitting, Cuff Size: Normal)   Pulse 100   Ht 5\' 11"  (1.803 m)   Wt 148 lb 9.6 oz (67.4 kg)   BMI 20.73 kg/m  General: Well developed white male in no acute distress; dirty/unkept. Head: Normocephalic and atraumatic Eyes:  Sclerae anicteric, conjunctiva pink. Ears: Normal auditory acuity Lungs: Clear throughout to auscultation Heart: Regular rate and rhythm Abdomen: Soft, non-distended. Normal bowel sounds.  Diffuse upper abdominal TTP but > in the epigastrium. Musculoskeletal: Symmetrical with no gross deformities  Skin: No lesions  on visible extremities Extremities: No edema  Neurological: Alert oriented x 4, grossly non-focal Psychological:  Alert and cooperative. Normal mood and affect  ASSESSMENT AND PLAN: -60 year old male with epigastric/upper abdominal pain and CT scan showing inflammatory changes around the tail the pancreas as well as a 4.6 cm cystic mass with worrisome features. Has loss of appetite, nausea, and weight loss as well.  Discussed with Dr. Ardis Hughs. We'll schedule for EUS for  evaluation.  The risks, benefits, and alternatives to EUS were discussed with the patient and he consents to proceed.   CC:  No ref. provider found

## 2016-03-20 NOTE — Progress Notes (Signed)
I agree with the above note, plan 

## 2016-03-28 ENCOUNTER — Ambulatory Visit: Payer: Self-pay | Admitting: Gastroenterology

## 2016-03-30 ENCOUNTER — Encounter (HOSPITAL_COMMUNITY): Payer: Self-pay | Admitting: *Deleted

## 2016-04-05 ENCOUNTER — Encounter (HOSPITAL_COMMUNITY): Payer: Self-pay | Admitting: Certified Registered"

## 2016-04-05 ENCOUNTER — Ambulatory Visit (HOSPITAL_COMMUNITY): Payer: Medicaid Other | Admitting: Certified Registered"

## 2016-04-05 ENCOUNTER — Telehealth: Payer: Self-pay

## 2016-04-05 ENCOUNTER — Encounter (HOSPITAL_COMMUNITY)
Admission: RE | Disposition: A | Discharge status: Home / Self Care | Payer: Self-pay | Source: Ambulatory Visit | Attending: Gastroenterology

## 2016-04-05 ENCOUNTER — Ambulatory Visit (HOSPITAL_COMMUNITY)
Admission: RE | Admit: 2016-04-05 | Discharge: 2016-04-05 | Disposition: A | Discharge status: Home / Self Care | Payer: Medicaid Other | Source: Ambulatory Visit | Attending: Gastroenterology | Admitting: Gastroenterology

## 2016-04-05 DIAGNOSIS — Z808 Family history of malignant neoplasm of other organs or systems: Secondary | ICD-10-CM | POA: Insufficient documentation

## 2016-04-05 DIAGNOSIS — C252 Malignant neoplasm of tail of pancreas: Secondary | ICD-10-CM | POA: Insufficient documentation

## 2016-04-05 DIAGNOSIS — C787 Secondary malignant neoplasm of liver and intrahepatic bile duct: Secondary | ICD-10-CM | POA: Diagnosis not present

## 2016-04-05 DIAGNOSIS — K219 Gastro-esophageal reflux disease without esophagitis: Secondary | ICD-10-CM | POA: Diagnosis not present

## 2016-04-05 DIAGNOSIS — Z803 Family history of malignant neoplasm of breast: Secondary | ICD-10-CM | POA: Insufficient documentation

## 2016-04-05 DIAGNOSIS — R948 Abnormal results of function studies of other organs and systems: Secondary | ICD-10-CM

## 2016-04-05 DIAGNOSIS — C259 Malignant neoplasm of pancreas, unspecified: Secondary | ICD-10-CM

## 2016-04-05 DIAGNOSIS — F17200 Nicotine dependence, unspecified, uncomplicated: Secondary | ICD-10-CM | POA: Insufficient documentation

## 2016-04-05 DIAGNOSIS — R1010 Upper abdominal pain, unspecified: Secondary | ICD-10-CM | POA: Diagnosis present

## 2016-04-05 HISTORY — PX: EUS: SHX5427

## 2016-04-05 SURGERY — UPPER ENDOSCOPIC ULTRASOUND (EUS) RADIAL
Anesthesia: Monitor Anesthesia Care

## 2016-04-05 MED ORDER — PROPOFOL 500 MG/50ML IV EMUL
INTRAVENOUS | Status: DC | PRN
  Administered 2016-04-05: 100 ug/kg/min via INTRAVENOUS

## 2016-04-05 MED ORDER — SODIUM CHLORIDE 0.9 % IV SOLN: INTRAVENOUS | Status: DC

## 2016-04-05 MED ORDER — PROPOFOL 10 MG/ML IV BOLUS
INTRAVENOUS | Status: AC
Start: 2016-04-05 — End: 2016-04-05
  Filled 2016-04-05: qty 40

## 2016-04-05 MED ORDER — LACTATED RINGERS IV SOLN
INTRAVENOUS | Status: DC
  Administered 2016-04-05: 1000 mL via INTRAVENOUS

## 2016-04-05 MED ORDER — PROPOFOL 10 MG/ML IV BOLUS
INTRAVENOUS | Status: AC
  Filled 2016-04-05: qty 20

## 2016-04-05 MED ORDER — PROPOFOL 10 MG/ML IV BOLUS
INTRAVENOUS | Status: DC | PRN
  Administered 2016-04-05 (×2): 50 mg via INTRAVENOUS
  Administered 2016-04-05: 30 mg via INTRAVENOUS

## 2016-04-05 MED ORDER — LIDOCAINE HCL (CARDIAC) 20 MG/ML IV SOLN
INTRAVENOUS | Status: DC | PRN
  Administered 2016-04-05: 20 mg via INTRAVENOUS

## 2016-04-05 NOTE — Telephone Encounter (Signed)
Referral has been sent to med onc, will wait for communication regarding appt date and time

## 2016-04-05 NOTE — Interval H&P Note (Signed)
History and Physical Interval Note:  04/05/2016 11:36 AM  Chad Arnold  has presented today for surgery, with the diagnosis of abormal pancreas  The various methods of treatment have been discussed with the patient and family. After consideration of risks, benefits and other options for treatment, the patient has consented to  Procedure(s): UPPER ENDOSCOPIC ULTRASOUND (EUS) RADIAL (N/A) as a surgical intervention .  The patient's history has been reviewed, patient examined, no change in status, stable for surgery.  I have reviewed the patient's chart and labs.  Questions were answered to the patient's satisfaction.     Milus Banister

## 2016-04-05 NOTE — Discharge Instructions (Signed)
YOU HAD AN ENDOSCOPIC PROCEDURE TODAY: Refer to the procedure report and other information in the discharge instructions given to you for any specific questions about what was found during the examination. If this information does not answer your questions, please call [place] office at [phone] to clarify.  ° °YOU SHOULD EXPECT: Some feelings of bloating in the abdomen. Passage of more gas than usual. Walking can help get rid of the air that was put into your GI tract during the procedure and reduce the bloating. If you had a lower endoscopy (such as a colonoscopy or flexible sigmoidoscopy) you may notice spotting of blood in your stool or on the toilet paper. Some abdominal soreness may be present for a day or two, also. ° °DIET: Your first meal following the procedure should be a light meal and then it is ok to progress to your normal diet. A half-sandwich or bowl of soup is an example of a good first meal. Heavy or fried foods are harder to digest and may make you feel nauseous or bloated. Drink plenty of fluids but you should avoid alcoholic beverages for 24 hours. If you had a esophageal dilation, please see attached instructions for diet.   ° °ACTIVITY: Your care partner should take you home directly after the procedure. You should plan to take it easy, moving slowly for the rest of the day. You can resume normal activity the day after the procedure however YOU SHOULD NOT DRIVE, use power tools, machinery or perform tasks that involve climbing or major physical exertion for 24 hours (because of the sedation medicines used during the test).  ° °SYMPTOMS TO REPORT IMMEDIATELY: °A gastroenterologist can be reached at any hour. Please call [phone]  for any of the following symptoms:  °Following lower endoscopy (colonoscopy, flexible sigmoidoscopy) °Excessive amounts of blood in the stool  °Significant tenderness, worsening of abdominal pains  °Swelling of the abdomen that is new, acute  °Fever of 100° or  higher  °Following upper endoscopy (EGD, EUS, ERCP, esophageal dilation) °Vomiting of blood or coffee ground material  °New, significant abdominal pain  °New, significant chest pain or pain under the shoulder blades  °Painful or persistently difficult swallowing  °New shortness of breath  °Black, tarry-looking or red, bloody stools ° °FOLLOW UP:  °If any biopsies were taken you will be contacted by phone or by letter within the next 1-3 weeks. Call [phone]  if you have not heard about the biopsies in 3 weeks.  °Please also call with any specific questions about appointments or follow up tests. ° °

## 2016-04-05 NOTE — Transfer of Care (Signed)
Immediate Anesthesia Transfer of Care Note  Patient: Chad Arnold  Procedure(s) Performed: Procedure(s): UPPER ENDOSCOPIC ULTRASOUND (EUS) RADIAL (N/A)  Patient Location: PACU  Anesthesia Type:MAC  Level of Consciousness:  sedated, patient cooperative and responds to stimulation  Airway & Oxygen Therapy:Patient Spontanous Breathing   Post-op Assessment:  Report given to PACU RN and Post -op Vital signs reviewed and stable  Post vital signs:  Reviewed and stable  Last Vitals:  Vitals:   04/05/16 1134 04/05/16 1301  BP: (!) 176/100 (!) 157/104  Pulse: 82 78  Resp: 13 20  Temp: [id number] C     Complications: No apparent anesthesia complications

## 2016-04-05 NOTE — Anesthesia Preprocedure Evaluation (Signed)
Anesthesia Evaluation  Patient identified by MRN, date of birth, ID band Patient awake    Reviewed: Allergy & Precautions, NPO status , Patient's Chart, lab work & pertinent test results  Airway Mallampati: II  TM Distance: >3 FB Neck ROM: Full    Dental no notable dental hx.    Pulmonary neg pulmonary ROS, Current Smoker,    Pulmonary exam normal breath sounds clear to auscultation       Cardiovascular negative cardio ROS Normal cardiovascular exam Rhythm:Regular Rate:Normal     Neuro/Psych negative neurological ROS  negative psych ROS   GI/Hepatic Neg liver ROS, GERD  ,  Endo/Other  negative endocrine ROS  Renal/GU negative Renal ROS     Musculoskeletal negative musculoskeletal ROS (+)   Abdominal   Peds  Hematology negative hematology ROS (+)   Anesthesia Other Findings   Reproductive/Obstetrics                             Anesthesia Physical Anesthesia Plan  ASA: II  Anesthesia Plan: MAC   Post-op Pain Management:    Induction:   Airway Management Planned:   Additional Equipment:   Intra-op Plan:   Post-operative Plan:   Informed Consent: I have reviewed the patients History and Physical, chart, labs and discussed the procedure including the risks, benefits and alternatives for the proposed anesthesia with the patient or authorized representative who has indicated his/her understanding and acceptance.   Dental advisory given  Plan Discussed with: CRNA  Anesthesia Plan Comments:         Anesthesia Quick Evaluation

## 2016-04-05 NOTE — Anesthesia Postprocedure Evaluation (Signed)
Anesthesia Post Note  Patient: Chad Arnold  Procedure(s) Performed: Procedure(s) (LRB): UPPER ENDOSCOPIC ULTRASOUND (EUS) RADIAL (N/A)  Patient location during evaluation: PACU Anesthesia Type: MAC Level of consciousness: awake and alert Pain management: pain level controlled Vital Signs Assessment: post-procedure vital signs reviewed and stable Respiratory status: spontaneous breathing Cardiovascular status: stable Anesthetic complications: no    Last Vitals:  Vitals:   04/05/16 1320 04/05/16 1325  BP: (!) 171/108 (!) 171/108  Pulse: 69 70  Resp: 17 17  Temp:      Last Pain:  Vitals:   04/05/16 1134  TempSrc: Oral  PainSc: [place]

## 2016-04-05 NOTE — H&P (View-Only) (Signed)
03/20/2016 Chad Arnold [id number] 14-Feb-1956   HISTORY OF PRESENT ILLNESS:  This is a 60 year old male with epigastric/upper abdominal pain and CT scan showing inflammatory changes around the tail the pancreas as well as a 4.6 cm cystic mass with worrisome features. Has loss of appetite, nausea, and weight loss as well.  Was referred her by Dr. Barry Dienes to discuss/schedule EUS.  The patient tells me that on October 13 presented to the emergency department with complaints of abdominal pain. His lipase was slightly elevated at 77 and sodium was low at 131, but otherwise CBC and CMP were unremarkable. He was sent to see Dr. Barry Dienes who then in turn referred him here. He says that since his ER visit he's had persistent pain that has not resolved at all and has been an 8 out of 10 at times. It is interfering with him being able to sleep. He tells me that he has lost weight and used to weigh in the 170s. He does drink alcohol and admits that he has been trying to cut back it is still drinking some beer.   Past Medical History:  Diagnosis Date  . GERD (gastroesophageal reflux disease)    History reviewed. No pertinent surgical history.  reports that he has been smoking.  He has never used smokeless tobacco. He reports that he drinks alcohol. He reports that he does not use drugs. family history includes Breast cancer in his sister; Cancer in his other; Diabetes in his mother and sister; Heart disease in his mother; Stroke in his other; Throat cancer in his father. No Known Allergies    Outpatient Encounter Prescriptions as of 03/20/2016  Medication Sig  . acetaminophen (TYLENOL) 500 MG tablet Take 1,000 mg by mouth daily as needed for moderate pain.  Marland Kitchen HYDROcodone-acetaminophen (NORCO) 5-325 MG tablet Take 1 tablet by mouth every 4 (four) hours as needed.  Marland Kitchen ibuprofen (ADVIL,MOTRIN) 200 MG tablet Take 400 mg by mouth daily as needed for moderate pain.  Marland Kitchen omeprazole (PRILOSEC OTC) 20 MG tablet  Take 20 mg by mouth daily.  . bacitracin ointment Apply 1 application topically 2 (two) times daily. (Patient not taking: Reported on 03/20/2016)  . cyclobenzaprine (FLEXERIL) 5 MG tablet Take 1 tablet (5 mg total) by mouth 3 (three) times daily as needed for muscle spasms. (Patient not taking: Reported on 03/20/2016)  . naproxen (NAPROSYN) 250 MG tablet Take 1 tablet (250 mg total) by mouth 2 (two) times daily with a meal. (Patient not taking: Reported on 03/20/2016)  . [DISCONTINUED] ondansetron (ZOFRAN ODT) 4 MG disintegrating tablet Take 1 tablet (4 mg total) by mouth every 8 (eight) hours as needed for nausea or vomiting.   No facility-administered encounter medications on file as of 03/20/2016.      REVIEW OF SYSTEMS  : All other systems reviewed and negative except where noted in the History of Present Illness.   PHYSICAL EXAM: BP 110/80 (BP Location: Right Arm, Patient Position: Sitting, Cuff Size: Normal)   Pulse 100   Ht 5\' 11"  (1.803 m)   Wt 148 lb 9.6 oz (67.4 kg)   BMI 20.73 kg/m  General: Well developed white male in no acute distress; dirty/unkept. Head: Normocephalic and atraumatic Eyes:  Sclerae anicteric, conjunctiva pink. Ears: Normal auditory acuity Lungs: Clear throughout to auscultation Heart: Regular rate and rhythm Abdomen: Soft, non-distended. Normal bowel sounds.  Diffuse upper abdominal TTP but > in the epigastrium. Musculoskeletal: Symmetrical with no gross deformities  Skin: No lesions  on visible extremities Extremities: No edema  Neurological: Alert oriented x 4, grossly non-focal Psychological:  Alert and cooperative. Normal mood and affect  ASSESSMENT AND PLAN: -60 year old male with epigastric/upper abdominal pain and CT scan showing inflammatory changes around the tail the pancreas as well as a 4.6 cm cystic mass with worrisome features. Has loss of appetite, nausea, and weight loss as well.  Discussed with Dr. Ardis Hughs. We'll schedule for EUS for  evaluation.  The risks, benefits, and alternatives to EUS were discussed with the patient and he consents to proceed.   CC:  No ref. provider found

## 2016-04-05 NOTE — Op Note (Signed)
[facility] Patient Name: Chad Arnold Procedure Date: 04/05/2016 MRN: [id number] Attending MD: Milus Banister , MD Date of Birth: 1956-01-06 CSN: [id number] Age: 60 Admit Type: Outpatient Procedure:                Upper EUS Indications:              Pancreatic cyst on CT scan Providers:                Milus Banister, MD, Carolynn Comment, RN, Cletis Athens, Technician Referring MD:             Stark Klein, MD Medicines:                Monitored Anesthesia Care Complications:            No immediate complications. Estimated blood loss:                            None. Estimated Blood Loss:     Estimated blood loss: none. Procedure:                Pre-Anesthesia Assessment:                           - Prior to the procedure, a History and Physical                            was performed, and patient medications and                            allergies were reviewed. The patient's tolerance of                            previous anesthesia was also reviewed. The risks                            and benefits of the procedure and the sedation                            options and risks were discussed with the patient.                            All questions were answered, and informed consent                            was obtained. Prior Anticoagulants: The patient has                            taken no previous anticoagulant or antiplatelet                            agents. ASA Grade Assessment: II - A patient with  mild systemic disease. After reviewing the risks                            and benefits, the patient was deemed in                            satisfactory condition to undergo the procedure.                           After obtaining informed consent, the endoscope was                            passed under direct vision. Throughout the                            procedure, the patient's blood  pressure, pulse, and                            oxygen saturations were monitored continuously. The                            [id number]) scope was introduced through                            the mouth, and advanced to the second part of                            duodenum. The upper EUS was accomplished without                            difficulty. The patient tolerated the procedure                            well. Scope In: Scope Out: Findings:      Endoscopic Finding :      The examined esophagus was endoscopically normal.      The entire examined stomach was endoscopically normal.      The examined duodenum was endoscopically normal.      Endosonographic Finding :      1. Mixed cystic/solid mass in the tail of pancreas, measuring at least       6cm across. This clearly involves the outer aspects of the posterior       gastric wall, appears to be invading the stomach.      2. Pancreas was otherwise normal (head, neck, body).      3. Multiple round, hypoechoic masses throughout the visualized (left       lobe) liver measuring from 56mm to 3cm across. These were very suspicious       for metastatic masses. One of the liver masses was sampled with       trangastric EUS FNA using a 25 guage needle, two passes.      4. Gallbladder was normal. Impression:               - 6cm mixed cystic/solid mass in the tail of  pancreas that appears to be invading the posterior                            wall of the stomach. Multiple liver masses, one of                            which was sampled by EUS FNA. The preliminary                            cytology reading is positive for malignancy                            (adenocarcinoma). Compared to CT scan 2 months ago,                            this is very impressive interval growth and clear                            metastatic spread of what I presume to be tail of                            pancreas  cancer. Moderate Sedation:      N/A- Per Anesthesia Care Recommendation:           - Discharge patient to home (ambulatory).                           - My office will arrange referral to medical                            oncology for newly diagnosed, metastatic pancreatic                            cancer.                           - Will present at next week GI cancer conference as                            well. Procedure Code(s):        --- Professional ---                           [phone], Esophagogastroduodenoscopy, flexible,                            transoral; with transendoscopic ultrasound-guided                            intramural or transmural fine needle                            aspiration/biopsy(s), (includes endoscopic                            ultrasound examination limited  to the esophagus,                            stomach or duodenum, and adjacent structures) Diagnosis Code(s):        --- Professional ---                           K86.89, Other specified diseases of pancreas                           K86.2, Cyst of pancreas CPT copyright 2016 American [employer] Association. All rights reserved. The codes documented in this report are preliminary and upon coder review may  be revised to meet current compliance requirements. Milus Banister, MD 04/05/2016 1:07:31 PM This report has been signed electronically. Number of Addenda: 0

## 2016-04-05 NOTE — Telephone Encounter (Signed)
-----   Message from Milus Banister, MD sent at 04/05/2016  1:09 PM EST ----- Annice Needy, See EUS below. Impressive interval change here   Dalin Caldera,  He needs referral to medical oncology for newly diagnosed metastatic pancreatic cancer (liver).  Manuela Schwartz, See above. Also can you put him on for next week GI cancer conference. Thanks    - 6cm mixed cystic/solid mass in the tail of pancreas that appears to be invading the posterior wall of the stomach. Multiple liver masses, one of which was sampled by EUS FNA. The preliminary cytology reading is positive for malignan (adenocarcinoma). Compared to CT scan 2 months ago, this is very impressive interval growth and clear metastatic spread of what I presume to be tail of pancreas cancer.

## 2016-04-05 NOTE — Telephone Encounter (Signed)
-----   Message from Milus Banister, MD sent at 04/05/2016  1:09 PM EST ----- Annice Needy, See EUS below. Impressive interval change here   Rainah Kirshner,  He needs referral to medical oncology for newly diagnosed metastatic pancreatic cancer (liver).  Manuela Schwartz, See above. Also can you put him on for next week GI cancer conference. Thanks    - 6cm mixed cystic/solid mass in the tail of pancreas that appears to be invading the posterior wall of the stomach. Multiple liver masses, one of which was sampled by EUS FNA. The preliminary cytology reading is positive for malignan (adenocarcinoma). Compared to CT scan 2 months ago, this is very impressive interval growth and clear metastatic spread of what I presume to be tail of pancreas cancer.

## 2016-04-06 ENCOUNTER — Telehealth: Payer: Self-pay | Admitting: *Deleted

## 2016-04-06 ENCOUNTER — Telehealth: Payer: Self-pay | Admitting: Gastroenterology

## 2016-04-06 ENCOUNTER — Encounter (HOSPITAL_COMMUNITY): Payer: Self-pay | Admitting: Gastroenterology

## 2016-04-06 MED ORDER — HYDROCODONE-ACETAMINOPHEN 10-325 MG PO TABS: 1.0000 | ORAL_TABLET | Freq: Four times a day (QID) | ORAL | 0 refills | Status: DC | PRN

## 2016-04-06 NOTE — Telephone Encounter (Signed)
Spoke with case manager, Dorthula Perfect and provided new patient appointment for 04/13/16 at 0915/0930 in [facility] with Dr. Truitt Merle. Informed of location of [place], valet service, and registration process. Reminded to bring insurance cards and a current medication list, including supplements. Alex verbalizes understanding and will be bringing him to the appointment Friday. He has just been approved for housing and will be getting government survivorship benefits soon from the death of his wife many years ago (he never collected due to not knowing it was available). He has children, but they will not be of any help to him.

## 2016-04-06 NOTE — Telephone Encounter (Signed)
Dr Ardis Hughs please advise.  Will you prescribe pain meds for this pt?

## 2016-04-06 NOTE — Telephone Encounter (Signed)
Oncology Nurse Navigator Documentation  Oncology Nurse Navigator Flowsheets 04/06/2016  Navigator Location [facility]  Referral date to RadOnc/MedOnc 04/05/2016  Navigator Encounter Type Introductory phone call  Abnormal Finding Date 02/10/2016  Left VM on phone # listed as cell phone to call for appointment. Also left VM w/his case manager at [employer] requesting his assistance and contacting him for an appointment. Plan to see him on 12/15 in [facility] at 0930 or 1030.

## 2016-04-06 NOTE — Telephone Encounter (Signed)
Can you come and sign it for me?

## 2016-04-06 NOTE — Telephone Encounter (Signed)
Yes, vicodin 10/325.  Take 1-2 every 5-6 hours PRN, disp 50, no refills.   He has metastatic pancreatic cancer

## 2016-04-09 MED ORDER — HYDROCODONE-ACETAMINOPHEN 10-325 MG PO TABS: 1.0000 | ORAL_TABLET | Freq: Four times a day (QID) | ORAL | 0 refills | Status: DC | PRN

## 2016-04-09 NOTE — Telephone Encounter (Signed)
Pt aware prescription is at the front desk for pick up by voice mail .

## 2016-04-13 ENCOUNTER — Encounter: Payer: Self-pay | Admitting: *Deleted

## 2016-04-13 ENCOUNTER — Ambulatory Visit (HOSPITAL_BASED_OUTPATIENT_CLINIC_OR_DEPARTMENT_OTHER): Payer: Self-pay | Admitting: Hematology

## 2016-04-13 ENCOUNTER — Encounter: Payer: Self-pay | Admitting: Hematology

## 2016-04-13 ENCOUNTER — Ambulatory Visit: Payer: Self-pay | Admitting: Physical Therapy

## 2016-04-13 ENCOUNTER — Encounter: Payer: Self-pay | Admitting: Nutrition

## 2016-04-13 VITALS — BP 150/86 | HR 76 | Temp 97.6°F | Resp 18 | Ht 71.0 in | Wt 147.4 lb

## 2016-04-13 DIAGNOSIS — C787 Secondary malignant neoplasm of liver and intrahepatic bile duct: Secondary | ICD-10-CM

## 2016-04-13 DIAGNOSIS — K5900 Constipation, unspecified: Secondary | ICD-10-CM

## 2016-04-13 DIAGNOSIS — C252 Malignant neoplasm of tail of pancreas: Secondary | ICD-10-CM

## 2016-04-13 DIAGNOSIS — C259 Malignant neoplasm of pancreas, unspecified: Secondary | ICD-10-CM

## 2016-04-13 DIAGNOSIS — G893 Neoplasm related pain (acute) (chronic): Secondary | ICD-10-CM

## 2016-04-13 MED ORDER — POLYETHYLENE GLYCOL 3350 17 G PO PACK: 17.0000 g | PACK | Freq: Every day | ORAL | 0 refills | Status: AC

## 2016-04-13 NOTE — Progress Notes (Signed)
Oncology Nurse Navigator Documentation  Oncology Nurse Navigator Flowsheets 04/13/2016  Navigator Location [facility]  Referral date to RadOnc/MedOnc -  Navigator Encounter Type Clinic/MDC  Abnormal Finding Date -  Confirmed Diagnosis Date 04/05/2016  Multidisiplinary Clinic Date 04/13/2016  Patient Visit Type MedOnc  Treatment Phase Pre-Tx/Tx Discussion  Barriers/Navigation Needs Transportation;Financial;Family concerns;Coordination of Film/video editor Finding Providers;Understanding Cancer/ Treatment Options;Pain/ Symptom Management;Newly Diagnosed Cancer Education;Transport During Treatment;Housing/ Lodging During Treatment;Concerns with Insurance Coverage;Concerns with Finances/ Eligibility  Interventions Coordination of Care;Education  Coordination of Care Appts--scheduled f/u for 05/11/15 at 0930 with Dr. Burr Medico and left VM with his case manager with the appointment  Education Method Verbal;Written  Acuity Level 3  Time Spent with Patient 104  Met with patient and his case manager, Cristie Hem during new patient visit. Explained the role of the GI Nurse Navigator and provided New Patient Packet with information on: 1. Pancreas cancer--CA 19.9 test, anatomy of pancreas 2. Support groups--was seen by CSW today 3. Advanced Directives--discussed in detail w/CSW 4. Fall Safety Plan Answered questions, reviewed current treatment plan using TEACH back and provided emotional support. Provided copy of current treatment plan and copy for case manager as well.   Merceda Elks, RN, BSN GI Oncology [place]

## 2016-04-13 NOTE — Progress Notes (Signed)
[facility]  [phone] Fax:([phone] [place] Note   Patient Care Team: [name], FNP as PCP - General (Nurse Practitioner) 04/13/2016  REFERRAL PHYSICIAN: Dr. Ardis Hughs   CHIEF COMPLAINTS/PURPOSE OF CONSULTATION:  Newly diagnosed metastatic pancreatic cancer   Oncology History   Pancreatic carcinoma metastatic to liver [facility])   Staging form: Pancreas, AJCC 7th Edition   - Clinical stage from 04/05/2016: Stage IV (T3, N0, M1) - Signed by Truitt Merle, MD on 04/15/2016      Pancreatic carcinoma metastatic to liver ([place])   02/10/2016 Imaging    CT abdomen and pelvis with contrast showed inflammatory changes surrounding the tail of the pancreas, a 4.6 cm cystic mass in the tail of the pancreas, suspicious for inflammatory cyst or cystic neoplasm. Fatty liver with nonspecific subcentimeter hypodense lesions.      04/05/2016 Initial Diagnosis    Malignant neoplasm of tail of pancreas ([place])      04/05/2016 Procedure    EUS by Dr. Ardis Hughs showed mixed cystic and solid mass in the tail of pancreas, measuring 6 cm, which invades posterior gastric wall. Multiple round hypoechoic masses in the left lobe of liver, measuring from 5 mm to 3 cm, suspicious for metastasis. Biopsied      04/05/2016 Initial Biopsy    FNA of liver mass through EUS showed adenocarcinoma.       HISTORY OF PRESENTING ILLNESS:  Chad Arnold 60 y.o. male is here because of newly diagnosed metastatic pancreatic cancer.   He has had abdominal pain for the past 4 months, located in upper epigastric area, it radiates to RUQ of abdomen, and he has occasioanl back pain. He denies nausea, he is also constipated, last BM was 10 days ago. His appetite has been low, but improved last week since he started to take pain medication, he lost about 30 lbs lately.   He was homeless for 4-5 years, until the summer 2017 when he was found in the woods by Education officer, museum and was arrange to  stay in a church. 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 He came in today with his Education officer, museum. He is widowed, has a son and a daughter, but they are not very close to him. He is a heavy smoker and also drinks alcohol heavily. He has lost most of his teeth, takes shower about once a month.  He recently established his care with a primary care physician, and was diagnosed with schizoaffective disorder. He was in [place] for his abdominal pain by CT scan, which showed a 4.6cm cystic mass in the tail of the pancreas. He was initially seen by Dr. Barry Dienes, and was referred to GI Dr. Ardis Hughs. He underwent EUS on 04/05/2016, which confirmed cystic mass in the pancreatic tail, and multiple liver lesions. Wall to liver lesions were biopsied by fine-needle aspiration under EUS, which showed adenocarcinoma. He is here to discuss further management.   Dr. Ardis Hughs has prescribed hydrocodone for him about low week ago, he takes a few tablets a day, his pain has better controlled, his appetite has improved. He is able to function well, no other new complains.   MEDICAL HISTORY:  Past Medical History:  Diagnosis Date  . GERD (gastroesophageal reflux disease)     SURGICAL HISTORY: Past Surgical History:  Procedure Laterality Date  . EUS N/A 04/05/2016   Procedure: UPPER ENDOSCOPIC ULTRASOUND (EUS) RADIAL;  Surgeon:  Milus Banister, MD;  Location: [facility];  Service: Endoscopy;  Laterality: N/A;  . NO PAST SURGERIES      SOCIAL HISTORY: Social History   Social History  . Marital status: Widowed    Spouse name: N/A  . Number of children: N/A  . Years of education: N/A   Occupational History  . Not on file.   Social History Main Topics  . Smoking status: Current Every Day Smoker    Packs/day: 1.00    Years: 40.00    Types: Cigarettes  . Smokeless tobacco: Never Used  . Alcohol use Yes     Comment: 40oz beer a week   . Drug use: No  .  Sexual activity: Not on file   Other Topics Concern  . Not on file   Social History Narrative   Widowed   Semi-homless: has been living in church and helped by church members   Not close relationship with family   Currently uninsured and no income-case manager helping     FAMILY HISTORY: Family History  Problem Relation Age of Onset  . Diabetes Sister   . Breast cancer Sister   . Stroke Other   . Cancer Other   . Heart disease Mother   . Diabetes Mother   . Throat cancer Father     ALLERGIES:  has No Known Allergies.  MEDICATIONS:  Current Outpatient Prescriptions  Medication Sig Dispense Refill  . cyclobenzaprine (FLEXERIL) 5 MG tablet Take 1 tablet (5 mg total) by mouth 3 (three) times daily as needed for muscle spasms. (Patient taking differently: Take 10 mg by mouth 3 (three) times daily as needed for muscle spasms. ) 21 tablet 0  . docusate sodium (COLACE) 100 MG capsule Take 100 mg by mouth daily as needed for mild constipation.    Marland Kitchen HYDROcodone-acetaminophen (NORCO) 10-325 MG tablet Take 1-2 tablets by mouth every 6 (six) hours as needed for moderate pain or severe pain (for pain). 50 tablet 0  . ibuprofen (ADVIL,MOTRIN) 200 MG tablet Take 600 mg by mouth daily as needed for moderate pain.     . mirtazapine (REMERON) 15 MG tablet Take 15 mg by mouth at bedtime.    Marland Kitchen omeprazole (PRILOSEC OTC) 20 MG tablet Take 40 mg by mouth daily.     . polyethylene glycol (MIRALAX) packet Take 17 g by mouth daily.  0   No current facility-administered medications for this visit.     REVIEW OF SYSTEMS:   Constitutional: Denies fevers, chills or abnormal night sweats Eyes: Denies blurriness of vision, double vision or watery eyes Ears, nose, mouth, throat, and face: Denies mucositis or sore throat Respiratory: Denies cough, dyspnea or wheezes Cardiovascular: Denies palpitation, chest discomfort or lower extremity swelling Gastrointestinal:  Denies nausea, heartburn or change in  bowel habits Skin: Denies abnormal skin rashes Lymphatics: Denies new lymphadenopathy or easy bruising Neurological:Denies numbness, tingling or new weaknesses Behavioral/Psych: Mood is stable, no new changes  All other systems were reviewed with the patient and are negative.  PHYSICAL EXAMINATION: ECOG PERFORMANCE STATUS: 1 - Symptomatic but completely ambulatory  Vitals:   04/13/16 0952  BP: (!) 150/86  Pulse: 76  Resp: 18  Temp: 97.6 F (36.4 C)   Filed Weights   04/13/16 0952  Weight: 147 lb 6.4 oz (66.9 kg)    GENERAL:alert, no distress and comfortable SKIN: skin color, texture, turgor are normal, no rashes or significant lesions EYES: normal, conjunctiva are pink and non-injected, sclera clear OROPHARYNX:no exudate,  no erythema and lips, buccal mucosa, and tongue normal  NECK: supple, thyroid normal size, non-tender, without nodularity LYMPH:  no palpable lymphadenopathy in the cervical, axillary or inguinal LUNGS: clear to auscultation and percussion with normal breathing effort HEART: regular rate & rhythm and no murmurs and no lower extremity edema ABDOMEN:abdomen soft, mild tenderness in the epigastric area, no organomegaly, normal bowel sounds Musculoskeletal:no cyanosis of digits and no clubbing  PSYCH: alert & oriented x 3 with fluent speech NEURO: no focal motor/sensory deficits  LABORATORY DATA:  I have reviewed the data as listed CBC Latest Ref Rng & Units 02/10/2016  WBC 4.0 - 10.5 K/uL 8.2  Hemoglobin 13.0 - 17.0 g/dL 14.8  Hematocrit 39.0 - 52.0 % 42.4  Platelets 150 - 400 K/uL 217   CMP Latest Ref Rng & Units 02/10/2016  Glucose 65 - 99 mg/dL 95  BUN 6 - 20 mg/dL 9  Creatinine 0.61 - 1.24 mg/dL 0.73  Sodium 135 - 145 mmol/L 131(L)  Potassium 3.5 - 5.1 mmol/L 4.4  Chloride 101 - 111 mmol/L 96(L)  CO2 22 - 32 mmol/L 25  Calcium 8.9 - 10.3 mg/dL 9.6  Total Protein 6.5 - 8.1 g/dL 7.8  Total Bilirubin 0.3 - 1.2 mg/dL 0.6  Alkaline Phos 38 - 126  U/L 76  AST 15 - 41 U/L 25  ALT 17 - 63 U/L 20   Pathology report: Diagnosis FINE NEEDLE ASPIRATION, ENDOSCOPIC, LIVER (SPECIMEN 1 OF 1 COLLECTED 04/05/16): MALIGNANT CELLS CONSISTENT WITH METASTATIC ADENOCARCINOMA. Preliminary Diagnosis Intraoperative Diagnosis: Adequate. (JDP)  Upper EUS Dr Ardis Hughs 04/05/2016 Endosonographic Finding 1. Mixed cystic/solid mass in the tail of pancreas, measuring at least 6cm across. This clearly involves the outer aspects of the posterior gastric wall, appears to be invading the stomach. 2. Pancreas was otherwise normal (head, neck, body). 3. Multiple round, hypoechoic masses throughout the visualized (left lobe) liver measuring from 65m to 3cm across. These were very suspicious for metastatic masses. One of the liver masses was sampled with trangastric EUS FNA using a 25 guage needle, two passes. 4. Gallbladder was normal.  IMPRESSION: - 6cm mixed cystic/solid mass in the tail of pancreas that appears to be invading the posterior wall of the stomach. Multiple liver masses, one of which was sampled by EUS FNA. The preliminary cytology reading is positive for malignancy (adenocarcinoma). Compared to CT scan 2 months ago, this is very impressive interval growth and clear metastatic spread of what I presume to be tail of pancreas cancer.  RADIOGRAPHIC STUDIES: I have personally reviewed the radiological images as listed and agreed with the findings in the report.  CT abdomen and pelvis with contrast, 02/10/2016 IMPRESSION: 1. Mild edema and inflammatory change surrounding the tail of the pancreas which appears enlarged. There is a 4.6 cm cystic mass within the tail of the pancreas which demonstrates worrisome features including thickened wall enhancement. Findings could relate to inflammatory cyst or inflammatory cystic neoplasm. Surgical consultation is suggested. 2. Fatty liver with small nonspecific sub cm hypodense lesions. 3. Normal  appendix. 4. Enlarged prostate gland with calcifications. 5. Atherosclerotic vascular disease of the aorta.  ASSESSMENT & PLAN:  60year old male, with heavy smoking and alcohol drinking history, homeless (stays in a church now) presented with abdominal pain, nausea, low appetite and weight loss.  1. 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 -He has biopsy confirmed liver metastasis, based on the imaging  findings, this is most consistent with metastatic pancreatic cancer to liver. -We reviewed the incurable natural history of metastatic pancreatic cancer, and overall very poor prognosis. The median survival without treatment is 4-6 months. -We discussed that surgery is not an option, and treatment options are chemotherapy. The [place] chemotherapy regimens, including single agent gemcitabine, vs more intensive regimen such as combination gemcitabine and Abraxane, or FOLFIRINOX, we'll reviewed with patient in details. -Due to his social situation, especially homeless and poor hygiene, I'm very concerned about the side effects from chemotherapy, especially the risk of infection. -I discussed alternative option with palliative alone, such as hospice, to manage his symptoms and preserve his quality of life for his terminal malignancy. I think this is a very reasonable option due to his medical comorbidities and very limited social support  -Due to his mental illness (schizoaffective disorder), I recommended him to have a medical power of attorney -With patient's permission, I discussed his diagnosis and treatment options with his son -I encouraged patient to discuss with his children and sister, to make a final decision if he wants to try chemo   2. Abdominal pain  -Secondary to pancreatic cancer -He will continue hydrocodone as needed for pain.  3. Schizoaffective affective  disorder -I encouraged him to follow-up with his primary care physician  4. Substance abuse -He still smokes heavily, and drink alcohol heavily -We discussed smoking and alcohol cessation, he is not very interested. I encouraged him to cut back first.  5. Constipation  -We discussed that narcotics can cause constipation. I encouraged him to drink more water, and eat more vegetables -We discussed the options of laxatives, I strongly encouraged him to use Senokot or MiraLAX as needed 1-3 times a day  6. Social support  -He is currently homeless, our social worker [name] met him today, housing options discussed with him    Recommendations: Based on information available as of today's consult. Recommendations may change depending on the results of further tests or exams. 1) Work on improving your home situation before treatment could begin 2) Return to Dr. Burr Medico in 3 weeks 3) May consider starting weekly chemotherapy with Gemzar Next Steps: 1) Work closely with your case manager and CSW for housing/income/transportation 2)  Need to discuss having a healthcare power of attorney 3)  Constipation: Miralax 17 grams in 8 ounces fluid 1-3 times daily  4)   Continue your pain medication as needed  No orders of the defined types were placed in this encounter.   All questions were answered. The patient knows to call the clinic with any problems, questions or concerns. I spent 55 minutes counseling the patient face to face. The total time spent in the appointment was 60 minutes and more than 50% was on counseling.     Truitt Merle, MD 04/13/2016

## 2016-04-13 NOTE — Addendum Note (Signed)
Addended by: Tania Ade on: 04/13/2016 02:44 PM   Modules accepted: Orders

## 2016-04-13 NOTE — Progress Notes (Signed)
[place] Psychosocial Distress Screening Clinical Social Work  Clinical Social Work met with pt and his case Freight forwarder from the [facility], Alex at GI clinic to introduce self, review role of CSW and to review the distress screening protocol.  The patient scored a 8 on the Psychosocial Distress Thermometer which indicates severe distress. Clinical Social Worker met with pt at length to assess for distress and other psychosocial needs. Pt reports to be currently living at a church that has allowed him to stay there. He was camping on their property and other areas in town for four years prior. Pt should be approved for medicaid and [employer] case manager, Cristie Hem is assisting with this process. The [employer] is attempting to secure a more long-term housing option, but are concerned his care needs may require assistance. Pt is regularly checked on by staff at the church, but if he moves to his own apartment, he will not have this daily support. Pt has family in town, but it appears to be a stressful situation. Pt will consider housing options. He may decide to pursue hospice care. CSW reviewed levels of housing assistance. Pt should soon start getting his wife's ss check as she passed away in 07/06/2002 and he had not pursued these benefits to date. Pt appears to have some aspects of mental health concerns and shared he was on medicine "that made me have to stretch" in the past to help "shrink my head". CSW reviewed resources available to assist and pt and CSW completed SCAT application today. CSW also discussed POA paperwork and provided pt with packet to complete at later date. Pt will consider his treatment plan and CSW will follow accordingly.   ONCBCN DISTRESS SCREENING 04/13/2016  Screening Type Initial Screening  Distress experienced in past week (1-10) 8  Practical problem type Housing;Insurance  Emotional problem type Depression;Adjusting to illness;Feeling hopeless  Physical Problem type Pain;Sleep/insomnia;Constipation/diarrhea   Physician notified of physical symptoms Yes  Referral to clinical social work Yes  Referral to financial advocate Yes  Referral to support programs Yes  Other SCAT application to assist with transportation    Clinical Social Worker follow up needed: Yes.    If yes, follow up plan:  See above Loren Racer, [place] Worker [place]  [facility] Phone: [phone] Fax: [phone]

## 2016-04-13 NOTE — Patient Instructions (Signed)
Care Plan Summary- 04/13/2016 Name:  Chad Arnold. Console      DOB:  1955-05-29 Your Medical Team:  Medical Oncologist:  Dr. Truitt Merle Radiation Oncologist:   Surgeon:    Type of Cancer: Metastatic Pancreas Carcinoma--adenocarcinoma  Stage/Grade: Stage IV *Exact staging of your cancer is based on size of the tumor, depth of invasion, involvement of lymph nodes or not, and whether or not the cancer has spread beyond the primary site   Recommendations: Based on information available as of today's consult. Recommendations may change depending on the results of further tests or exams. 1) Work on improving your home situation before treatment could begin 2) Return to Dr. Burr Medico in 3 weeks 3) May consider starting weekly chemotherapy with Gemzar Next Steps: 1) Work closely with your case manager and CSW for housing/income/transportation 2)  Need to discuss having a healthcare power of attorney 3)  Constipation: Miralax 17 grams in 8 ounces fluid every day 4)   Continue your pain medication as needed   Questions? Merceda Elks, RN, BSN at [phone]. Manuela Schwartz is your Oncology Nurse Navigator and is available to assist you while you're receiving your medical care at [facility].

## 2016-04-15 ENCOUNTER — Encounter: Payer: Self-pay | Admitting: Hematology

## 2016-04-17 ENCOUNTER — Telehealth: Payer: Self-pay | Admitting: Gastroenterology

## 2016-04-17 NOTE — Telephone Encounter (Signed)
The pt was advised that narcotics can cause constipation and he should start taking miralax one dose daily and titrate up or down as needed.  He was also advised to call his pharmacy when he is down to 1 day of pain medications and we will get his medication refilled. Pt agreed and will call the on the call MD if any concerns if the office is closed.

## 2016-04-17 NOTE — Telephone Encounter (Signed)
Left message on machine to call back  

## 2016-04-18 ENCOUNTER — Encounter: Payer: Self-pay | Admitting: *Deleted

## 2016-04-18 ENCOUNTER — Telehealth: Payer: Self-pay | Admitting: *Deleted

## 2016-04-18 ENCOUNTER — Telehealth: Payer: Self-pay | Admitting: Hematology

## 2016-04-18 NOTE — Telephone Encounter (Signed)
sw pt to confirm r/s appt to 05/01/16 per LOS

## 2016-04-18 NOTE — Telephone Encounter (Signed)
I spoke with [name] and she will communicate with Chad Arnold about his pain meds (I will refill or slightly increase his dose) and possibly get his children involved in his decision making on chemo, unless he assigns someone else as his POA. I'm concerned about his competency to make chemotherapy decision, due to his mental illness. I will see him back the week of 1/1 to discuss chemo again.   Truitt Merle MD

## 2016-04-18 NOTE — Telephone Encounter (Signed)
Phone message from [place] /SW.  She saw patient when he was here for GI clinic.  Patient's case manager is Dalbert Batman and phone number is on face sheet.  [name] suggests that patient have referral with [facility].  He is struggling with treatment decisions and also some symptom management issues.   He will return on 05/10/16 and it might be good to have that referral already completed.  Will send message to Dr. Burr Medico to see if she wants to do this referral.

## 2016-04-18 NOTE — Progress Notes (Signed)
[place] Work  Clinical Social Work was contacted by patient's Tourist information centre manager from the [employer], [employer].  He shared pt is wanting to go ahead with treatment, but has questions and is trying to make decisions. Per Cristie Hem, pt is estranged from extended family. Clinical Social Worker discussed concerns that medical team has about treatment due to risk factors present; unstable housing, possible mental health needs, ongoing alcohol and tobacco use. Pt also has limited support systems in place. CSW discussed case with Dr. Burr Medico and she would like to have pt come in earlier with case manager and/or family members/decision makers. CSW relayed this to Cristie Hem and also for patient to reach out to her office for symptom management. CSW is unaware of competency issues currently, but clarification is needed from case manager. Case manager is assisting pt with medicaid applications and ss disability applications. Competency concerns have not been brought up and per previous discussions pt appears competent to make decisions, however clarification is needed. CSW to follow and assist.    Clinical Social Work interventions: Pt advocacy  Loren Racer, [place] Worker [place]  [place] Phone: [phone] Fax: [phone]

## 2016-04-20 ENCOUNTER — Telehealth: Payer: Self-pay | Admitting: Gastroenterology

## 2016-04-20 MED ORDER — HYDROCODONE-ACETAMINOPHEN 10-325 MG PO TABS: 1.0000 | ORAL_TABLET | Freq: Four times a day (QID) | ORAL | 0 refills | Status: AC | PRN

## 2016-04-20 NOTE — Telephone Encounter (Signed)
Yes, He has widely metastatic pancreatic cancer.  thanks

## 2016-04-20 NOTE — Telephone Encounter (Signed)
Pt calling for a refill on hydrocodone. Dr. Ardis Hughs are you ok refilling this? Please advise.

## 2016-04-20 NOTE — Telephone Encounter (Signed)
Script left up front for pt to pick up.

## 2016-04-27 ENCOUNTER — Telehealth: Payer: Self-pay | Admitting: *Deleted

## 2016-04-27 ENCOUNTER — Telehealth: Payer: Self-pay | Admitting: Gastroenterology

## 2016-04-27 NOTE — Telephone Encounter (Signed)
Attempted to call case manager Dalbert Batman but voice mail full.  Unable to leave message.

## 2016-04-27 NOTE — Telephone Encounter (Signed)
Received call from pt stating that he needs his pain med refilled.  Script given by Dr Ardis Hughs but Dr Ardis Hughs out of office & was instructed to call Dr Burr Medico.  Pt reports abdominal pain & is having trouble eating & sleeping due to the pain.  He is using ducolax suppository for bowels & states that bowels moved small amt this am.  He reports taking pain med-1 every 2-3 hours but states that he has 12-13 left which he says won't get him through to his next appt on tues.  Pt received # 50 04/20/16 from Dr Ardis Hughs.  Message to Dr Feng/Pod RN

## 2016-04-27 NOTE — Telephone Encounter (Signed)
Spoke with patient and he reports an increase in his pain that is not helped by Vicodin. Patient is going to call Dr. Burr Medico to discuss the new increase in pain and possible change in medication.

## 2016-04-27 NOTE — Telephone Encounter (Signed)
Dr. Ardis Hughs pt. Appropriate for his oncologist to manage his pain at this point.

## 2016-04-30 NOTE — Progress Notes (Addendum)
[place]  [phone] Fax:([phone]) [phone]  Clinic follow up Note   Patient Care Team: [name], FNP as PCP - General (Nurse Practitioner) Milus Banister, MD as Attending Physician (Gastroenterology) Truitt Merle, MD as Consulting Physician (Hematology) 05/01/2016     CHIEF COMPLAINTS:  Follow up metastatic pancreatic cancer   Oncology History   Pancreatic carcinoma metastatic to liver [facility])   Staging form: Pancreas, AJCC 7th Edition   - Clinical stage from 04/05/2016: Stage IV (T3, N0, M1) - Signed by Truitt Merle, MD on 04/15/2016      Pancreatic carcinoma metastatic to liver ([place])   02/10/2016 Imaging    CT abdomen and pelvis with contrast showed inflammatory changes surrounding the tail of the pancreas, a 4.6 cm cystic mass in the tail of the pancreas, suspicious for inflammatory cyst or cystic neoplasm. Fatty liver with nonspecific subcentimeter hypodense lesions.      04/05/2016 Initial Diagnosis    Malignant neoplasm of tail of pancreas ([place])      04/05/2016 Procedure    EUS by Dr. Ardis Hughs showed mixed cystic and solid mass in the tail of pancreas, measuring 6 cm, which invades posterior gastric wall. Multiple round hypoechoic masses in the left lobe of liver, measuring from 5 mm to 3 cm, suspicious for metastasis. Biopsied      04/05/2016 Initial Biopsy    FNA of liver mass through EUS showed adenocarcinoma.       HISTORY OF PRESENTING ILLNESS:  Chad Arnold 61 y.o. male is here because of newly diagnosed metastatic pancreatic cancer.   He has had abdominal pain for the past 4 months, located in upper epigastric area, it radiates to RUQ of abdomen, and he has occasioanl back pain. He denies nausea, he is also constipated, last BM was 10 days ago. His appetite has been low, but improved last week since he started to take pain medication, he lost about 30 lbs lately.   He was homeless for 4-5 years, until the summer 2017 when he was found in  the woods by Education officer, museum and was arrange to stay in a church. 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 He came in today with his Education officer, museum. He is widowed, has a son and a daughter, but they are not very close to him. He is a heavy smoker and also drinks alcohol heavily. He has lost most of his teeth, takes shower about once a month.  He recently established his care with a primary care physician, and was diagnosed with schizoaffective disorder. He was in [place] for his abdominal pain by CT scan, which showed a 4.6cm cystic mass in the tail of the pancreas. He was initially seen by Dr. Barry Dienes, and was referred to GI Dr. Ardis Hughs. He underwent EUS on 04/05/2016, which confirmed cystic mass in the pancreatic tail, and multiple liver lesions. Wall to liver lesions were biopsied by fine-needle aspiration under EUS, which showed adenocarcinoma. He is here to discuss further management.   Dr. Ardis Hughs has prescribed hydrocodone for him about low week ago, he takes a few tablets a day, his pain has better controlled, his appetite has improved. He is able to function well, no other new complains.   CURRENT THERAPY:  Supportive care  INTERIM HISTORY:  Chad Arnold returns for follow-up. He is accompanied by his case English as a second language teacher. He complains of worsening epigastric abdominal pain, he has been taking Norco  every 4 hours, still feels his pain is not controlled. He has constipation, for which he takes MiraLAX and Dulcolax suppository, his last bowel movement was 2 days ago. He has spoke with his son and daughter about his cancer treatment, his son does not want him to get chemotherapy, but he states it is his own decision about chemotherapy.   MEDICAL HISTORY:  Past Medical History:  Diagnosis Date  . GERD (gastroesophageal reflux disease)     SURGICAL HISTORY: Past Surgical History:  Procedure Laterality Date  . EUS N/A 04/05/2016   Procedure: UPPER  ENDOSCOPIC ULTRASOUND (EUS) RADIAL;  Surgeon: Milus Banister, MD;  Location: [facility];  Service: Endoscopy;  Laterality: N/A;  . NO PAST SURGERIES      SOCIAL HISTORY: Social History   Social History  . Marital status: Widowed    Spouse name: N/A  . Number of children: N/A  . Years of education: N/A   Occupational History  . Not on file.   Social History Main Topics  . Smoking status: Current Every Day Smoker    Packs/day: 1.00    Years: 40.00    Types: Cigarettes  . Smokeless tobacco: Never Used  . Alcohol use Yes     Comment: 40oz beer a week   . Drug use: No  . Sexual activity: Not on file   Other Topics Concern  . Not on file   Social History Narrative   Widowed   Semi-homless: has been living in church and helped by church members   Not close relationship with family   Currently uninsured and no income-case manager helping     FAMILY HISTORY: Family History  Problem Relation Age of Onset  . Diabetes Sister   . Breast cancer Sister   . Stroke Other   . Cancer Other   . Heart disease Mother   . Diabetes Mother   . Throat cancer Father     ALLERGIES:  has No Known Allergies.  MEDICATIONS:  Current Outpatient Prescriptions  Medication Sig Dispense Refill  . cyclobenzaprine (FLEXERIL) 5 MG tablet Take 1 tablet (5 mg total) by mouth 3 (three) times daily as needed for muscle spasms. (Patient taking differently: Take 10 mg by mouth 3 (three) times daily as needed for muscle spasms. ) 21 tablet 0  . docusate sodium (COLACE) 100 MG capsule Take 100 mg by mouth daily as needed for mild constipation.    Marland Kitchen HYDROcodone-acetaminophen (NORCO) 10-325 MG tablet Take 1-2 tablets by mouth every 6 (six) hours as needed for moderate pain or severe pain (for pain). 50 tablet 0  . ibuprofen (ADVIL,MOTRIN) 200 MG tablet Take 600 mg by mouth daily as needed for moderate pain.     . mirtazapine (REMERON) 15 MG tablet Take 15 mg by mouth at bedtime.    Marland Kitchen morphine (MSIR) 15  MG tablet Take 0.5-1 tablets (7.5-15 mg total) by mouth every 4 (four) hours as needed for severe pain. 30 tablet 0  . omeprazole (PRILOSEC OTC) 20 MG tablet Take 40 mg by mouth daily.     . polyethylene glycol (MIRALAX) packet Take 17 g by mouth daily.  0   No current facility-administered medications for this visit.     REVIEW OF SYSTEMS:   Constitutional: Denies fevers, chills or abnormal night sweats Eyes: Denies blurriness of vision, double vision or watery eyes Ears, nose, mouth, throat, and face: Denies mucositis or sore throat Respiratory: Denies cough, dyspnea or wheezes Cardiovascular: Denies palpitation, chest discomfort  or lower extremity swelling Gastrointestinal:  Denies nausea, heartburn or change in bowel habits Skin: Denies abnormal skin rashes Lymphatics: Denies new lymphadenopathy or easy bruising Neurological:Denies numbness, tingling or new weaknesses Behavioral/Psych: Mood is stable, no new changes  All other systems were reviewed with the patient and are negative.  PHYSICAL EXAMINATION: ECOG PERFORMANCE STATUS: 1 - Symptomatic but completely ambulatory  Vitals:   05/01/16 1054  BP: (!) 153/88  Pulse: 95  Resp: 16  Temp: 98.6 F (37 C)   Filed Weights   05/01/16 1054  Weight: 143 lb 1.6 oz (64.9 kg)    GENERAL:alert, no distress and comfortable SKIN: skin color, texture, turgor are normal, no rashes or significant lesions EYES: normal, conjunctiva are pink and non-injected, sclera clear OROPHARYNX:no exudate, no erythema and lips, buccal mucosa, and tongue normal  NECK: supple, thyroid normal size, non-tender, without nodularity LYMPH:  no palpable lymphadenopathy in the cervical, axillary or inguinal LUNGS: clear to auscultation and percussion with normal breathing effort HEART: regular rate & rhythm and no murmurs and no lower extremity edema ABDOMEN:abdomen soft, mild tenderness in the epigastric area, no organomegaly, normal bowel  sounds Musculoskeletal:no cyanosis of digits and no clubbing  PSYCH: alert & oriented x 3 with fluent speech NEURO: no focal motor/sensory deficits  LABORATORY DATA:  I have reviewed the data as listed CBC Latest Ref Rng & Units 05/01/2016 02/10/2016  WBC 4.0 - 10.3 10e3/uL 11.9(H) 8.2  Hemoglobin 13.0 - 17.1 g/dL 15.4 14.8  Hematocrit 38.4 - 49.9 % 42.8 42.4  Platelets 140 - 400 10e3/uL 356 217   CMP Latest Ref Rng & Units 05/01/2016 02/10/2016  Glucose 70 - 140 mg/dl 111 95  BUN 7.0 - 26.0 mg/dL 7.8 9  Creatinine 0.7 - 1.3 mg/dL 0.6(L) 0.73  Sodium 136 - 145 mEq/L 132(L) 131(L)  Potassium 3.5 - 5.1 mEq/L 4.4 4.4  Chloride 101 - 111 mmol/L - 96(L)  CO2 22 - 29 mEq/L 27 25  Calcium 8.4 - 10.4 mg/dL 9.9 9.6  Total Protein 6.4 - 8.3 g/dL 7.8 7.8  Total Bilirubin 0.20 - 1.20 mg/dL 0.50 0.6  Alkaline Phos 40 - 150 U/L 172(H) 76  AST 5 - 34 U/L 27 25  ALT 0 - 55 U/L 20 20   Pathology report: Diagnosis 04/05/2016 FINE NEEDLE ASPIRATION, ENDOSCOPIC, LIVER (SPECIMEN 1 OF 1 COLLECTED 04/05/16): MALIGNANT CELLS CONSISTENT WITH METASTATIC ADENOCARCINOMA. Preliminary Diagnosis Intraoperative Diagnosis: Adequate. (JDP)  Upper EUS Dr Ardis Hughs 04/05/2016 Endosonographic Finding 1. Mixed cystic/solid mass in the tail of pancreas, measuring at least 6cm across. This clearly involves the outer aspects of the posterior gastric wall, appears to be invading the stomach. 2. Pancreas was otherwise normal (head, neck, body). 3. Multiple round, hypoechoic masses throughout the visualized (left lobe) liver measuring from 62mm to 3cm across. These were very suspicious for metastatic masses. One of the liver masses was sampled with trangastric EUS FNA using a 25 guage needle, two passes. 4. Gallbladder was normal.  IMPRESSION: - 6cm mixed cystic/solid mass in the tail of pancreas that appears to be invading the posterior wall of the stomach. Multiple liver masses, one of which was sampled by EUS FNA. The  preliminary cytology reading is positive for malignancy (adenocarcinoma). Compared to CT scan 2 months ago, this is very impressive interval growth and clear metastatic spread of what I presume to be tail of pancreas cancer.  RADIOGRAPHIC STUDIES: I have personally reviewed the radiological images as listed and agreed with the findings in the  report.  CT abdomen and pelvis with contrast, 02/10/2016 IMPRESSION: 1. Mild edema and inflammatory change surrounding the tail of the pancreas which appears enlarged. There is a 4.6 cm cystic mass within the tail of the pancreas which demonstrates worrisome features including thickened wall enhancement. Findings could relate to inflammatory cyst or inflammatory cystic neoplasm. Surgical consultation is suggested. 2. Fatty liver with small nonspecific sub cm hypodense lesions. 3. Normal appendix. 4. Enlarged prostate gland with calcifications. 5. Atherosclerotic vascular disease of the aorta.  ASSESSMENT & PLAN:  61 year old male, with heavy smoking and alcohol drinking history, homeless (stays in a church now) presented with abdominal pain, nausea, low appetite and weight loss.  1. 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 -He has biopsy confirmed liver metastasis, based on the imaging findings, this is most consistent with metastatic pancreatic cancer to liver. -We reviewed the incurable natural history of metastatic pancreatic cancer, and overall very poor prognosis. The median survival without treatment is 4-6 months. -We discussed that surgery is not an option, and treatment options are chemotherapy. The [place] chemotherapy regimens, including single agent gemcitabine, vs more intensive regimen such as combination gemcitabine and Abraxane, or FOLFIRINOX, we'll reviewed with patient in details. -Due to his mental illness and  social situation, especially homeless and poor hygiene, I'm very concerned about the side effects from chemotherapy, especially the risk of infection. I think is a poor candidate for chemotherapy. I discussed that the benefit of palliative chemotherapy is small (may prolong his life for a few months), and I do not recommend him to get chemotherapy. -I recommend him to consider palliative care alone, especially hospice care. The median survival for untreated metastatic pancreatic cancer is less than 6 months, he is appropriate for hospice. I introduced the hospice care service to him and his case manager Cristie Hem, he agrees with it. I'll make a referral today.  2. Abdominal pain  -Secondary to pancreatic cancer -Hydrocodone seems to be not adequate for his pain control. I'll change it to morphine 7.5-15mg  every 4 hours as needed for pain  -May consider long-acting morphine in a few weeks if he needed, hospice care team will titrate his pain medication. -We discussed the option of celiac nerve block if his pain is poorly controlled by medication alone. He is agreeable   3. Schizoaffective affective disorder -I encouraged him to follow-up with his primary care physician  4. Substance abuse -He still smokes heavily, and drink alcohol heavily -We discussed smoking and alcohol cessation, he is not very interested. I encouraged him to cut back first.  5. Constipation  -We discussed that narcotics can cause constipation. I encouraged him to drink more water, and eat more vegetables -We discussed the options of laxatives, I strongly encouraged him to use Senokot or MiraLAX as needed 1-3 times a day  6. Social support  -He is currently homeless, our Education officer, museum f/u him again today   7. Goal of care -We had a long discussion about the goal of care, which is palliative, patient voiced good understanding and agreeable. -I'll refer him to hospice today, he is agreeable.  Plan -He was switched hydrocodone  to morphine 7.5-15 mg every 4 hours as needed for his pain, prescription was given to him today -Hospice referral -We'll consider celiac nerve block by IR if his pain is not controlled by medications   Orders Placed This Encounter  Procedures  .  CBC with Differential    Standing Status:   Standing    Number of Occurrences:   30    Standing Expiration Date:   05/01/2019  . Comprehensive metabolic panel    Standing Status:   Standing    Number of Occurrences:   30    Standing Expiration Date:   05/01/2019  . CA 19.9    Standing Status:   Standing    Number of Occurrences:   30    Standing Expiration Date:   05/01/2019    All questions were answered. The patient knows to call the clinic with any problems, questions or concerns.  I spent 30 minutes counseling the patient face to face. The total time spent in the appointment was 40 minutes and more than 50% was on counseling.     Truitt Merle, MD 05/01/2016

## 2016-05-01 ENCOUNTER — Encounter: Payer: Self-pay | Admitting: *Deleted

## 2016-05-01 ENCOUNTER — Encounter: Payer: Self-pay | Admitting: Hematology

## 2016-05-01 ENCOUNTER — Other Ambulatory Visit (HOSPITAL_BASED_OUTPATIENT_CLINIC_OR_DEPARTMENT_OTHER): Payer: Self-pay

## 2016-05-01 ENCOUNTER — Telehealth: Payer: Self-pay | Admitting: *Deleted

## 2016-05-01 ENCOUNTER — Other Ambulatory Visit: Payer: Self-pay | Admitting: *Deleted

## 2016-05-01 ENCOUNTER — Ambulatory Visit (HOSPITAL_BASED_OUTPATIENT_CLINIC_OR_DEPARTMENT_OTHER): Payer: Self-pay | Admitting: Hematology

## 2016-05-01 VITALS — BP 153/88 | HR 95 | Temp 98.6°F | Resp 16 | Ht 71.0 in | Wt 143.1 lb

## 2016-05-01 DIAGNOSIS — K5900 Constipation, unspecified: Secondary | ICD-10-CM

## 2016-05-01 DIAGNOSIS — C259 Malignant neoplasm of pancreas, unspecified: Secondary | ICD-10-CM

## 2016-05-01 DIAGNOSIS — C252 Malignant neoplasm of tail of pancreas: Secondary | ICD-10-CM

## 2016-05-01 DIAGNOSIS — C787 Secondary malignant neoplasm of liver and intrahepatic bile duct: Secondary | ICD-10-CM

## 2016-05-01 DIAGNOSIS — Z7189 Other specified counseling: Secondary | ICD-10-CM | POA: Insufficient documentation

## 2016-05-01 LAB — CBC WITH DIFFERENTIAL/PLATELET
BASO%: 0.2 % (ref 0.0–2.0)
Basophils Absolute: 0 10*3/uL (ref 0.0–0.1)
EOS%: 1.1 % (ref 0.0–7.0)
Eosinophils Absolute: 0.1 10*3/uL (ref 0.0–0.5)
HEMATOCRIT: 42.8 % (ref 38.4–49.9)
HEMOGLOBIN: 15.4 g/dL (ref 13.0–17.1)
LYMPH#: 1.4 10*3/uL (ref 0.9–3.3)
LYMPH%: 11.8 % — ABNORMAL LOW (ref 14.0–49.0)
MCH: 34.9 pg — ABNORMAL HIGH (ref 27.2–33.4)
MCHC: 36 g/dL (ref 32.0–36.0)
MCV: 97.1 fL (ref 79.3–98.0)
MONO#: 1.6 10*3/uL — AB (ref 0.1–0.9)
MONO%: 13.4 % (ref 0.0–14.0)
NEUT%: 73.5 % (ref 39.0–75.0)
NEUTROS ABS: 8.7 10*3/uL — AB (ref 1.5–6.5)
PLATELETS: 356 10*3/uL (ref 140–400)
RBC: 4.41 10*6/uL (ref 4.20–5.82)
RDW: 13.4 % (ref 11.0–14.6)
WBC: 11.9 10*3/uL — AB (ref 4.0–10.3)

## 2016-05-01 LAB — COMPREHENSIVE METABOLIC PANEL
ALBUMIN: 3.7 g/dL (ref 3.5–5.0)
ALT: 20 U/L (ref 0–55)
ANION GAP: 13 meq/L — AB (ref 3–11)
AST: 27 U/L (ref 5–34)
Alkaline Phosphatase: 172 U/L — ABNORMAL HIGH (ref 40–150)
BILIRUBIN TOTAL: 0.5 mg/dL (ref 0.20–1.20)
BUN: 7.8 mg/dL (ref 7.0–26.0)
CALCIUM: 9.9 mg/dL (ref 8.4–10.4)
CHLORIDE: 92 meq/L — AB (ref 98–109)
CO2: 27 mEq/L (ref 22–29)
CREATININE: 0.6 mg/dL — AB (ref 0.7–1.3)
EGFR: >90 mL/min/{1.73_m2} (ref >90)
Glucose: 111 mg/dl (ref 70–140)
Potassium: 4.4 mEq/L (ref 3.5–5.1)
Sodium: 132 mEq/L — ABNORMAL LOW (ref 136–145)
TOTAL PROTEIN: 7.8 g/dL (ref 6.4–8.3)

## 2016-05-01 MED ORDER — MORPHINE SULFATE 15 MG PO TABS: 7.5000 mg | ORAL_TABLET | ORAL | 0 refills | Status: AC | PRN

## 2016-05-01 NOTE — Progress Notes (Signed)
[place] [employer]  Holiday representative met with patient and patient's [facility] case Freight forwarder after his appointment with the medical oncologist to offer support and assess for additional needs.  Patient stated after meeting with the doctor he has a better understanding on his diagnosis.  Patient/case manager stated that a referral to the Hospice palliative care team and managing pain was the current treatment plan.  Patient is currently connected with the [facility] and has been living in a church; which has also been providing support.  Patient has applied for Medicaid and Social Security, but both are still pending.  Because patient is not in active treatment there are limited resources for financial assistance.  Patient has a friend who has been helping him pay for his medication, but stated this was his main concern.  CSW will continue to research what additional assistance may be available.  CSW provided contact information and encouraged patient and case manager to call with questions or concerns.    Johnnye Lana, MSW, LCSW, OSW-C Clinical Social Worker [facility] [phone]

## 2016-05-01 NOTE — Telephone Encounter (Signed)
Called [facility] & spoke with Amy & referral made for hospice per Dr Burr Medico.

## 2016-05-02 ENCOUNTER — Telehealth: Payer: Self-pay | Admitting: Hematology

## 2016-05-02 LAB — CANCER ANTIGEN 19-9: CA 19-9: 28012 U/mL — ABNORMAL HIGH (ref 0–35)

## 2016-05-02 NOTE — Telephone Encounter (Signed)
Per 1/2 los hospice referral/IR referral for celiac nerve block. Hospice referral will be taken care of by desk nurse. Patient will be contacted by IR once order/referral entered for eval/procedure.

## 2016-05-10 ENCOUNTER — Ambulatory Visit: Payer: Self-pay | Admitting: Hematology

## 2016-05-18 ENCOUNTER — Telehealth: Payer: Self-pay | Admitting: *Deleted

## 2016-05-18 NOTE — Telephone Encounter (Signed)
Received call from [facility]/Tanya RN reporting that pt refused visit today for weekly visit due to having a housing appointment.  FYI to Dr Burr Medico.

## 2016-06-17 ENCOUNTER — Emergency Department (HOSPITAL_COMMUNITY)

## 2016-06-17 ENCOUNTER — Inpatient Hospital Stay (HOSPITAL_COMMUNITY)
Admission: EM | Admit: 2016-06-17 | Discharge: 2016-06-19 | DRG: 871 | Disposition: A | Discharge status: Hospice | Attending: Family Medicine | Admitting: Family Medicine

## 2016-06-17 ENCOUNTER — Encounter (HOSPITAL_COMMUNITY): Payer: Self-pay

## 2016-06-17 DIAGNOSIS — Z515 Encounter for palliative care: Secondary | ICD-10-CM | POA: Diagnosis present

## 2016-06-17 DIAGNOSIS — E860 Dehydration: Secondary | ICD-10-CM | POA: Diagnosis present

## 2016-06-17 DIAGNOSIS — I959 Hypotension, unspecified: Secondary | ICD-10-CM | POA: Diagnosis present

## 2016-06-17 DIAGNOSIS — K219 Gastro-esophageal reflux disease without esophagitis: Secondary | ICD-10-CM | POA: Diagnosis present

## 2016-06-17 DIAGNOSIS — R000 Tachycardia, unspecified: Secondary | ICD-10-CM | POA: Diagnosis present

## 2016-06-17 DIAGNOSIS — Z66 Do not resuscitate: Secondary | ICD-10-CM | POA: Diagnosis present

## 2016-06-17 DIAGNOSIS — G893 Neoplasm related pain (acute) (chronic): Secondary | ICD-10-CM | POA: Diagnosis present

## 2016-06-17 DIAGNOSIS — M79673 Pain in unspecified foot: Secondary | ICD-10-CM | POA: Diagnosis present

## 2016-06-17 DIAGNOSIS — D649 Anemia, unspecified: Secondary | ICD-10-CM

## 2016-06-17 DIAGNOSIS — F17210 Nicotine dependence, cigarettes, uncomplicated: Secondary | ICD-10-CM | POA: Diagnosis present

## 2016-06-17 DIAGNOSIS — Z681 Body mass index (BMI) 19 or less, adult: Secondary | ICD-10-CM

## 2016-06-17 DIAGNOSIS — C259 Malignant neoplasm of pancreas, unspecified: Secondary | ICD-10-CM | POA: Diagnosis present

## 2016-06-17 DIAGNOSIS — N179 Acute kidney failure, unspecified: Secondary | ICD-10-CM | POA: Diagnosis present

## 2016-06-17 DIAGNOSIS — I731 Thromboangiitis obliterans [Buerger's disease]: Secondary | ICD-10-CM | POA: Diagnosis present

## 2016-06-17 DIAGNOSIS — E871 Hypo-osmolality and hyponatremia: Secondary | ICD-10-CM | POA: Diagnosis present

## 2016-06-17 DIAGNOSIS — C252 Malignant neoplasm of tail of pancreas: Secondary | ICD-10-CM | POA: Diagnosis present

## 2016-06-17 DIAGNOSIS — R609 Edema, unspecified: Secondary | ICD-10-CM | POA: Diagnosis present

## 2016-06-17 DIAGNOSIS — C7800 Secondary malignant neoplasm of unspecified lung: Secondary | ICD-10-CM | POA: Diagnosis not present

## 2016-06-17 DIAGNOSIS — E872 Acidosis: Secondary | ICD-10-CM | POA: Diagnosis present

## 2016-06-17 DIAGNOSIS — E440 Moderate protein-calorie malnutrition: Secondary | ICD-10-CM | POA: Diagnosis present

## 2016-06-17 DIAGNOSIS — J189 Pneumonia, unspecified organism: Secondary | ICD-10-CM | POA: Diagnosis present

## 2016-06-17 DIAGNOSIS — D638 Anemia in other chronic diseases classified elsewhere: Secondary | ICD-10-CM | POA: Diagnosis present

## 2016-06-17 DIAGNOSIS — C787 Secondary malignant neoplasm of liver and intrahepatic bile duct: Secondary | ICD-10-CM | POA: Diagnosis present

## 2016-06-17 DIAGNOSIS — Z808 Family history of malignant neoplasm of other organs or systems: Secondary | ICD-10-CM

## 2016-06-17 DIAGNOSIS — Z79891 Long term (current) use of opiate analgesic: Secondary | ICD-10-CM

## 2016-06-17 DIAGNOSIS — A419 Sepsis, unspecified organism: Secondary | ICD-10-CM | POA: Diagnosis present

## 2016-06-17 DIAGNOSIS — I4891 Unspecified atrial fibrillation: Secondary | ICD-10-CM | POA: Diagnosis not present

## 2016-06-17 DIAGNOSIS — F259 Schizoaffective disorder, unspecified: Secondary | ICD-10-CM | POA: Diagnosis present

## 2016-06-17 DIAGNOSIS — Z79899 Other long term (current) drug therapy: Secondary | ICD-10-CM

## 2016-06-17 HISTORY — DX: Gout, unspecified: M10.9

## 2016-06-17 HISTORY — DX: Malignant (primary) neoplasm, unspecified: C80.1

## 2016-06-17 HISTORY — DX: Malignant neoplasm of pancreas, unspecified: C25.9

## 2016-06-17 HISTORY — DX: Secondary malignant neoplasm of liver and intrahepatic bile duct: C78.7

## 2016-06-17 LAB — COMPREHENSIVE METABOLIC PANEL
ALBUMIN: 2.6 g/dL — AB (ref 3.5–5.0)
ALT: 65 U/L — AB (ref 17–63)
AST: 111 U/L — AB (ref 15–41)
Alkaline Phosphatase: 748 U/L — ABNORMAL HIGH (ref 38–126)
Anion gap: 14 (ref 5–15)
BILIRUBIN TOTAL: 1.2 mg/dL (ref 0.3–1.2)
BUN: 29 mg/dL — AB (ref 6–20)
CO2: 21 mmol/L — ABNORMAL LOW (ref 22–32)
CREATININE: 1.02 mg/dL (ref 0.61–1.24)
Calcium: 8.2 mg/dL — ABNORMAL LOW (ref 8.9–10.3)
Chloride: 95 mmol/L — ABNORMAL LOW (ref 101–111)
GFR calc Af Amer: >60 mL/min (ref >60)
GLUCOSE: 125 mg/dL — AB (ref 65–99)
Potassium: 4.2 mmol/L (ref 3.5–5.1)
Sodium: 130 mmol/L — ABNORMAL LOW (ref 135–145)
TOTAL PROTEIN: 5.7 g/dL — AB (ref 6.5–8.1)

## 2016-06-17 LAB — CBC WITH DIFFERENTIAL/PLATELET
Basophils Absolute: 0 10*3/uL (ref 0.0–0.1)
Basophils Relative: 0 %
EOS ABS: 0 10*3/uL (ref 0.0–0.7)
EOS PCT: 0 %
HCT: 23.5 % — ABNORMAL LOW (ref 39.0–52.0)
Hemoglobin: 8.2 g/dL — ABNORMAL LOW (ref 13.0–17.0)
LYMPHS ABS: 1.2 10*3/uL (ref 0.7–4.0)
Lymphocytes Relative: 7 %
MCH: 34.3 pg — AB (ref 26.0–34.0)
MCHC: 34.9 g/dL (ref 30.0–36.0)
MCV: 98.3 fL (ref 78.0–100.0)
MONO ABS: 1.2 10*3/uL — AB (ref 0.1–1.0)
Monocytes Relative: 7 %
Neutro Abs: 15.3 10*3/uL — ABNORMAL HIGH (ref 1.7–7.7)
Neutrophils Relative %: 86 %
PLATELETS: 162 10*3/uL (ref 150–400)
RBC: 2.39 MIL/uL — AB (ref 4.22–5.81)
RDW: 15.8 % — AB (ref 11.5–15.5)
WBC: 17.7 10*3/uL — AB (ref 4.0–10.5)

## 2016-06-17 LAB — I-STAT CG4 LACTIC ACID, ED
LACTIC ACID, VENOUS: 2.81 mmol/L — AB (ref 0.5–1.9)
Lactic Acid, Venous: 4.26 mmol/L (ref 0.5–1.9)

## 2016-06-17 LAB — PREPARE RBC (CROSSMATCH)

## 2016-06-17 LAB — POC OCCULT BLOOD, ED: Fecal Occult Bld: POSITIVE — AB

## 2016-06-17 LAB — ABO/RH: ABO/RH(D): A NEG

## 2016-06-17 LAB — PROTIME-INR
INR: 1.3
PROTHROMBIN TIME: 16.3 s — AB (ref 11.4–15.2)

## 2016-06-17 MED ORDER — SODIUM CHLORIDE 0.9 % IV BOLUS (SEPSIS)
1000.0000 mL | Freq: Once | INTRAVENOUS | Status: AC
  Administered 2016-06-17: 1000 mL via INTRAVENOUS

## 2016-06-17 MED ORDER — HYDROMORPHONE HCL 1 MG/ML IJ SOLN
1.0000 mg | Freq: Once | INTRAMUSCULAR | Status: AC
  Administered 2016-06-17: 1 mg via INTRAVENOUS
  Filled 2016-06-17: qty 1

## 2016-06-17 MED ORDER — VANCOMYCIN HCL IN DEXTROSE 750-5 MG/150ML-% IV SOLN
750.0000 mg | Freq: Two times a day (BID) | INTRAVENOUS | Status: DC
  Administered 2016-06-18: 750 mg via INTRAVENOUS
  Filled 2016-06-17 (×2): qty 150

## 2016-06-17 MED ORDER — PIPERACILLIN-TAZOBACTAM 3.375 G IVPB
3.3750 g | Freq: Three times a day (TID) | INTRAVENOUS | Status: DC
  Administered 2016-06-18 – 2016-06-19 (×4): 3.375 g via INTRAVENOUS
  Filled 2016-06-17 (×4): qty 50

## 2016-06-17 MED ORDER — VANCOMYCIN HCL IN DEXTROSE 1-5 GM/200ML-% IV SOLN
1000.0000 mg | Freq: Once | INTRAVENOUS | Status: AC
  Administered 2016-06-17: 1000 mg via INTRAVENOUS
  Filled 2016-06-17: qty 200

## 2016-06-17 MED ORDER — SODIUM CHLORIDE 0.9 % IV SOLN
10.0000 mL/h | Freq: Once | INTRAVENOUS | Status: AC
Start: 2016-06-17 — End: 2016-06-17
  Administered 2016-06-17: 10 mL/h via INTRAVENOUS

## 2016-06-17 MED ORDER — PIPERACILLIN-TAZOBACTAM 3.375 G IVPB 30 MIN
3.3750 g | Freq: Once | INTRAVENOUS | Status: AC
  Administered 2016-06-17: 3.375 g via INTRAVENOUS
  Filled 2016-06-17: qty 50

## 2016-06-17 MED ORDER — HYDROMORPHONE HCL 1 MG/ML IJ SOLN
1.0000 mg | INTRAMUSCULAR | Status: DC | PRN
  Administered 2016-06-17 – 2016-06-19 (×7): 1 mg via INTRAVENOUS
  Filled 2016-06-17 (×7): qty 1

## 2016-06-17 NOTE — ED Notes (Signed)
BEAR HUGGER REMOVED AND ADDITIONAL WARM BLANKETS APPLIED.

## 2016-06-17 NOTE — Progress Notes (Signed)
Pharmacy Antibiotic Note  Chad Arnold is a 61 y.o. male admitted on 06/17/2016 with sepsis.  Pharmacy has been consulted for vancomycin and zosyn dosing. Patient with stage IV met pancreatic cancer,  Presents with complaints of painful urination.  Today, 06/17/2016:  WBC elevated  LA elevated  Renal: SCr WNL  Hypothermic in ED  Plan:  Vancomycin 1gm x 1 in ED then 782m IV q12h  Follow renal function closely - check daily SCr  Check trough if remains on vancomycin > 48-72h  Zosyn 3.375gm q8h over 4h infusion following dose in ED  Narrow as soon as clinically appropriate  Height: '5\' 11"'  (180.3 cm) Weight: 130 lb (59 kg) IBW/kg (Calculated) : 75.3  Temp (24hrs), Avg:96.1 F (35.6 C), Min:96.1 F (35.6 C), Max:96.1 F (35.6 C)   Recent Labs Lab 06/17/16 1818 06/17/16 1830  WBC 17.7*  --   LATICACIDVEN  --  4.26*    CrCl cannot be calculated (Patient's most recent lab result is older than the maximum 21 days allowed.).    No Known Allergies  Antimicrobials this admission: 2/18 vanco >> 2/18 zosyn >>  Dose adjustments this admission:  Microbiology results: 2/18 Bcx:  Thank you for allowing pharmacy to be a part of this patient's care.  DDoreene Eland PharmD, BCPS.   Pager: [phone]/18/2018 6:47 PM

## 2016-06-17 NOTE — ED Provider Notes (Signed)
[facility] DEPT Provider Note   CSN: [id number] Arrival date & time: 06/17/16  1735     History   Chief Complaint Chief Complaint  Patient presents with  . Abdominal Pain  . Dysuria    HPI Chad Arnold is a 61 y.o. male.  HPI Patient has metastatic pancreatic cancer. He does have hospice assisting him at home and providing morphine for pain control. The patient reports that he however is getting worse and has constant abdominal pain. He also reports that he is too weak to get up and go to the bathroom. He reports he has been having a lot of pain in his left foot. He is not sure if he has gout but he reports it is severely painful. He reports a medicine he takes for his abdominal pain doesn't help his foot much. He denies he's had any fever. He reports his stool has been loose and dark. Past Medical History:  Diagnosis Date  . Cancer ([place])   . GERD (gastroesophageal reflux disease)   . Gout   . Pancreatic cancer metastasized to liver ([place])    AND METS TO THE STOMACH    Patient Active Problem List   Diagnosis Date Noted  . Goals of care, counseling/discussion 05/01/2016  . Pancreatic carcinoma metastatic to liver ([place])   . Liver metastasis ([place])   . Abnormal pancreas function test 03/20/2016  . Abdominal pain, epigastric 03/20/2016  . Pancreatic mass 03/20/2016    Past Surgical History:  Procedure Laterality Date  . EUS N/A 04/05/2016   Procedure: UPPER ENDOSCOPIC ULTRASOUND (EUS) RADIAL;  Surgeon: Milus Banister, MD;  Location: [facility];  Service: Endoscopy;  Laterality: N/A;  . NO PAST SURGERIES         Home Medications    Prior to Admission medications   Medication Sig Start Date End Date Taking? Authorizing Provider  [employer] SENNA PLUS 8.6-50 MG tablet Take 2 tablets by mouth 2 (two) times daily as needed for mild constipation.  05/04/16  Yes Historical Provider, MD  docusate sodium (COLACE) 100 MG capsule Take 100 mg by mouth daily as needed for mild  constipation.   Yes Historical Provider, MD  ibuprofen (ADVIL,MOTRIN) 200 MG tablet Take 600 mg by mouth daily as needed for moderate pain.    Yes Historical Provider, MD  methadone (DOLOPHINE) 5 MG tablet Take 1.5 tablets by mouth 3 (three) times daily. 06/05/16  Yes Historical Provider, MD  morphine (MSIR) 15 MG tablet Take 0.5-1 tablets (7.5-15 mg total) by mouth every 4 (four) hours as needed for severe pain. 05/01/16  Yes Truitt Merle, MD  HYDROcodone-acetaminophen (NORCO) 10-325 MG tablet Take 1-2 tablets by mouth every 6 (six) hours as needed for moderate pain or severe pain (for pain). Patient not taking: Reported on 06/17/2016 04/20/16   Jerene Bears, MD  polyethylene glycol [facility]) packet Take 17 g by mouth daily. Patient not taking: Reported on 06/17/2016 04/13/16   Truitt Merle, MD    Family History Family History  Problem Relation Age of Onset  . Diabetes Sister   . Breast cancer Sister   . Stroke Other   . Cancer Other   . Heart disease Mother   . Diabetes Mother   . Throat cancer Father     Social History Social History  Substance Use Topics  . Smoking status: Current Every Day Smoker    Packs/day: 1.00    Years: 40.00    Types: Cigarettes  . Smokeless tobacco: Never Used  .  Alcohol use Yes     Comment: 40oz beer a week      Allergies   Patient has no known allergies.   Review of Systems Review of Systems 10 Systems reviewed and are negative for acute change except as noted in the HPI.   Physical Exam Updated Vital Signs BP 106/86   Pulse (!) 154   Temp (!) 96.1 F (35.6 C) (Rectal)   Resp 24   Ht 5\' 11"  (1.803 m)   Wt 130 lb (59 kg)   SpO2 100%   BMI 18.13 kg/m   Physical Exam  Constitutional: He is oriented to person, place, and time.  Patient is cachectic. He is however alert and nontoxic. He has no respiratory distress. He is pale in appearance.  HENT:  Head: Normocephalic and atraumatic.  Mouth/Throat: Oropharynx is clear and moist.  Eyes:  Conjunctivae and EOM are normal.  Cardiovascular:  Tachycardia no appreciable rub murmur gallop  Pulmonary/Chest:  Intact breath sounds diminished at the bases. No respiratory distress.  Abdominal:  Abdomen is very uncomfortable to palpation in the epigastrium. Abdomen however is not distended and no guarding.  Genitourinary:  Genitourinary Comments: Brownish black stool in the vault. No frank blood. No cranberry appearing stool. Patient has a firm mass in the area of the prostate.  Musculoskeletal:  Extremities are cachectic. The patient has violaceous discoloration of the great toe and the fourth toe on the left foot. Right foot has mild edema but no erythema.  Neurological: He is alert and oriented to person, place, and time. He exhibits normal muscle tone. Coordination normal.  Skin: Skin is warm and dry. There is pallor.  Psychiatric: He has a normal mood and affect.     ED Treatments / Results  Labs (all labs ordered are listed, but only abnormal results are displayed) Labs Reviewed  COMPREHENSIVE METABOLIC PANEL - Abnormal; Notable for the following:       Result Value   Sodium 130 (*)    Chloride 95 (*)    CO2 21 (*)    Glucose, Bld 125 (*)    BUN 29 (*)    Calcium 8.2 (*)    Total Protein 5.7 (*)    Albumin 2.6 (*)    AST 111 (*)    ALT 65 (*)    Alkaline Phosphatase 748 (*)    All other components within normal limits  CBC WITH DIFFERENTIAL/PLATELET - Abnormal; Notable for the following:    WBC 17.7 (*)    RBC 2.39 (*)    Hemoglobin 8.2 (*)    HCT 23.5 (*)    MCH 34.3 (*)    RDW 15.8 (*)    Neutro Abs 15.3 (*)    Monocytes Absolute 1.2 (*)    All other components within normal limits  PROTIME-INR - Abnormal; Notable for the following:    Prothrombin Time 16.3 (*)    All other components within normal limits  I-STAT CG4 LACTIC ACID, ED - Abnormal; Notable for the following:    Lactic Acid, Venous 4.26 (*)    All other components within normal limits    CULTURE, BLOOD (ROUTINE X 2)  CULTURE, BLOOD (ROUTINE X 2)  URINALYSIS, ROUTINE W REFLEX MICROSCOPIC  CREATININE, SERUM  I-STAT CG4 LACTIC ACID, ED  POC OCCULT BLOOD, ED  I-STAT CG4 LACTIC ACID, ED  I-STAT CG4 LACTIC ACID, ED  PREPARE RBC (CROSSMATCH)  TYPE AND SCREEN  ABO/RH    EKG  EKG Interpretation  Date/Time:  Sunday June 17 2016 18:02:42 EST Ventricular Rate:  169 PR Interval:    QRS Duration: 88 QT Interval:  299 QTC Calculation: 502 R Axis:   104 Text Interpretation:  Supraventricular tachycardia Right axis deviation Probable anteroseptal infarct, old Minimal ST depression, inferior leads Borderline T abnormalities, inferior leads agree. no old comparison Confirmed by Johnney Killian, MD, Jeannie Done [phone]) on 06/17/2016 7:46:14 PM       Radiology Dg Chest 2 View  Result Date: 06/17/2016 CLINICAL DATA:  Foot pain stomach pain and weakness for 4 days history of pancreas cancer with metastatic disease to liver and stomach EXAM: CHEST  2 VIEW COMPARISON:  CT 02/09/2006 FINDINGS: Diffuse coarse interstitial opacities bilaterally. Scattered irregular slightly nodular appearing opacities within the upper lobes and left lung base. No pleural effusion. Normal heart size. No pneumothorax. IMPRESSION: Diffuse coarse interstitial pulmonary opacities with scattered vague somewhat nodular appearing pulmonary opacities. Differential considerations include diffuse infection/pneumonia and metastatic disease. Chest CT could be obtained for further evaluation. Electronically Signed   By: Donavan Foil M.D.   On: 06/17/2016 18:58    Procedures Procedures (including critical care time) CRITICAL CARE Performed by: Charlesetta Shanks   Total critical care time: 45 minutes  Critical care time was exclusive of separately billable procedures and treating other patients.  Critical care was necessary to treat or prevent imminent or life-threatening deterioration.  Critical care was time spent  personally by me on the following activities: development of treatment plan with patient and/or surrogate as well as nursing, discussions with consultants, evaluation of patient's response to treatment, examination of patient, obtaining history from patient or surrogate, ordering and performing treatments and interventions, ordering and review of laboratory studies, ordering and review of radiographic studies, pulse oximetry and re-evaluation of patient's condition. Medications Ordered in ED Medications  HYDROmorphone (DILAUDID) injection 1 mg (1 mg Intravenous Given 06/17/16 2023)  piperacillin-tazobactam (ZOSYN) IVPB 3.375 g (not administered)  vancomycin (VANCOCIN) IVPB 750 mg/150 ml premix (not administered)  0.9 %  sodium chloride infusion (not administered)  sodium chloride 0.9 % bolus 1,000 mL (0 mLs Intravenous Stopped 06/17/16 2137)    And  sodium chloride 0.9 % bolus 1,000 mL (1,000 mLs Intravenous New Bag/Given 06/17/16 1910)  piperacillin-tazobactam (ZOSYN) IVPB 3.375 g (0 g Intravenous Stopped 06/17/16 2008)  vancomycin (VANCOCIN) IVPB 1000 mg/200 mL premix (0 mg Intravenous Stopped 06/17/16 2137)  HYDROmorphone (DILAUDID) injection 1 mg (1 mg Intravenous Given 06/17/16 1909)     Initial Impression / Assessment and Plan / ED Course  I have reviewed the triage vital signs and the nursing notes.  Pertinent labs & imaging results that were available during my care of the patient were reviewed by me and considered in my medical decision making (see chart for details).    Consult: Hospitalist for admission.  Final Clinical Impressions(s) / ED Diagnoses   Final diagnoses:  Pancreatic carcinoma metastatic to liver ([place])  Sepsis, due to unspecified organism [facility])  Symptomatic anemia    New Prescriptions New Prescriptions   No medications on file     Charlesetta Shanks, MD 06/24/16 [phone]

## 2016-06-17 NOTE — H&P (Signed)
History and Physical    Chad Arnold [id number] DOB: 1955-11-17 DOA: 06/17/2016  Referring MD/NP/PA: EDP PCP: [name], FNP  Outpatient Specialists: Oncologist Dr Burr Medico, [facility] Patient coming from: Home  Chief Complaint: Generalised weakness, sob  HPI: Chad Arnold is a 61 y.o. male with medical history significant for metastatic pancreatic cancer, currently being followed by hospice at home.   He presented with 2-3 days history of progressively worsening sob with generalised weakness associated with cough productive of yellowish sputum, without fever or chills, and worsening generalised abdominal pains despite morphine tx without nausea or vomiting but his oral intake had been diminished. No melena or hematochezia. He reports his stool had been loose and dark about a month ago, and has not had any since then. He also mentions questionable history of gout with left foot pains. ED Course: He was noted to be hypothermic with leukocytosis and hypotension. Rectal exam revealed brownish black stool without frank blood.He indicated to EDP he wanted to be treated in the interim with abx.He was given IVF bolus x2 with IV Analgesia, Vanc/Zosyn and 1 unit PRBC tx initiated. Hospitalist Service consulted for admission  Review of Systems: As per HPI otherwise 10 point review of systems negative.    Past Medical History:  Diagnosis Date  . Cancer ([place])   . GERD (gastroesophageal reflux disease)   . Gout   . Pancreatic cancer metastasized to liver ([place])    AND METS TO THE STOMACH    Past Surgical History:  Procedure Laterality Date  . EUS N/A 04/05/2016   Procedure: UPPER ENDOSCOPIC ULTRASOUND (EUS) RADIAL;  Surgeon: Milus Banister, MD;  Location: [facility];  Service: Endoscopy;  Laterality: N/A;  . NO PAST SURGERIES       reports that he has been smoking Cigarettes.  He has a 40.00 pack-year smoking history. He has never used smokeless tobacco. He reports that he  drinks alcohol. He reports that he does not use drugs.  No Known Allergies  Family History  Problem Relation Age of Onset  . Diabetes Sister   . Breast cancer Sister   . Stroke Other   . Cancer Other   . Heart disease Mother   . Diabetes Mother   . Throat cancer Father      Prior to Admission medications   Medication Sig Start Date End Date Taking? Authorizing Provider  [employer] SENNA PLUS 8.6-50 MG tablet Take 2 tablets by mouth 2 (two) times daily as needed for mild constipation.  05/04/16  Yes Historical Provider, MD  docusate sodium (COLACE) 100 MG capsule Take 100 mg by mouth daily as needed for mild constipation.   Yes Historical Provider, MD  ibuprofen (ADVIL,MOTRIN) 200 MG tablet Take 600 mg by mouth daily as needed for moderate pain.    Yes Historical Provider, MD  methadone (DOLOPHINE) 5 MG tablet Take 1.5 tablets by mouth 3 (three) times daily. 06/05/16  Yes Historical Provider, MD  morphine (MSIR) 15 MG tablet Take 0.5-1 tablets (7.5-15 mg total) by mouth every 4 (four) hours as needed for severe pain. 05/01/16  Yes Truitt Merle, MD  HYDROcodone-acetaminophen (NORCO) 10-325 MG tablet Take 1-2 tablets by mouth every 6 (six) hours as needed for moderate pain or severe pain (for pain). Patient not taking: Reported on 06/17/2016 04/20/16   Jerene Bears, MD  polyethylene glycol [facility]) packet Take 17 g by mouth daily. Patient not taking: Reported on 06/17/2016 04/13/16   Truitt Merle, MD  Physical Exam: Vitals:   06/17/16 2000 06/17/16 2100 06/17/16 2204 06/17/16 2232  BP: 100/68 106/86 112/83 110/84  Pulse:   89 84  Resp: 14 24 18 20   Temp:   97.6 F (36.4 C) 97.6 F (36.4 C)  TempSrc:   Oral Oral  SpO2:   100% 100%  Weight:      Height:          Constitutional: NAD, calm, comfortable Vitals:   06/17/16 2000 06/17/16 2100 06/17/16 2204 06/17/16 2232  BP: 100/68 106/86 112/83 110/84  Pulse:   89 84  Resp: 14 24 18 20   Temp:   97.6 F (36.4 C) 97.6 F (36.4 C)  TempSrc:    Oral Oral  SpO2:   100% 100%  Weight:      Height:       Eyes: PERRL, lids and conjunctivae normal ENMT: Mucous membranes are dry. Posterior pharynx clear of any exudate or lesions.Normal dentition.  Neck: normal, supple, no masses, no thyromegaly Respiratory: Diminished BS without any obvious adventitious sounds. Normal respiratory effort. No accessory muscle use.  Cardiovascular: Mildly tachycardic, regular rhythm, no murmurs / rubs / gallops. Trace bipedal pitting  edema. 2+ pedal pulses. No carotid bruits.  Abdomen: Mild epigastric tenderness without rebound, no obvious organomegaly appreciated with light palpation due to pain. Bowel sounds normoactive.  Musculoskeletal: no clubbing / cyanosis. Left great toes mildly erythematous, not warm but cold to touch with diminished cap refill Skin: no rashes, lesions, ulcers. No induration Neurologic: Alert Ox 3, no focal deficit  Psychiatric: Normal judgment and insight.  Labs on Admission: I have personally reviewed following labs and imaging studies  CBC:  Recent Labs Lab 06/17/16 1818  WBC 17.7*  NEUTROABS 15.3*  HGB 8.2*  HCT 23.5*  MCV 98.3  PLT 0000000   Basic Metabolic Panel:  Recent Labs Lab 06/17/16 1818  NA 130*  K 4.2  CL 95*  CO2 21*  GLUCOSE 125*  BUN 29*  CREATININE 1.02  CALCIUM 8.2*   GFR: Estimated Creatinine Clearance: 63.5 mL/min (by C-G formula based on SCr of 1.02 mg/dL). Liver Function Tests:  Recent Labs Lab 06/17/16 1818  AST 111*  ALT 65*  ALKPHOS 748*  BILITOT 1.2  PROT 5.7*  ALBUMIN 2.6*   No results for input(s): LIPASE, AMYLASE in the last 168 hours. No results for input(s): AMMONIA in the last 168 hours. Coagulation Profile:  Recent Labs Lab 06/17/16 1818  INR 1.30   Cardiac Enzymes: No results for input(s): CKTOTAL, CKMB, CKMBINDEX, TROPONINI in the last 168 hours. BNP (last 3 results) No results for input(s): PROBNP in the last 8760 hours. HbA1C: No results for input(s):  HGBA1C in the last 72 hours. CBG: No results for input(s): GLUCAP in the last 168 hours. Lipid Profile: No results for input(s): CHOL, HDL, LDLCALC, TRIG, CHOLHDL, LDLDIRECT in the last 72 hours. Thyroid Function Tests: No results for input(s): TSH, T4TOTAL, FREET4, T3FREE, THYROIDAB in the last 72 hours. Anemia Panel: No results for input(s): VITAMINB12, FOLATE, FERRITIN, TIBC, IRON, RETICCTPCT in the last 72 hours. Urine analysis:    Component Value Date/Time   COLORURINE YELLOW 02/10/2016 [place] 02/10/2016 1417   LABSPEC 1.012 02/10/2016 1417   PHURINE 6.5 02/10/2016 1417   GLUCOSEU NEGATIVE 02/10/2016 1417   HGBUR NEGATIVE 02/10/2016 1417   BILIRUBINUR NEGATIVE 02/10/2016 1417   KETONESUR NEGATIVE 02/10/2016 1417   PROTEINUR NEGATIVE 02/10/2016 1417   NITRITE NEGATIVE 02/10/2016 1417   LEUKOCYTESUR NEGATIVE  02/10/2016 1417   Sepsis Labs: @LABRCNTIP (procalcitonin:4,lacticidven:4) )No results found for this or any previous visit (from the past 240 hour(s)).   Radiological Exams on Admission: Dg Chest 2 View  Result Date: 06/17/2016 CLINICAL DATA:  Foot pain stomach pain and weakness for 4 days history of pancreas cancer with metastatic disease to liver and stomach EXAM: CHEST  2 VIEW COMPARISON:  CT 02/09/2006 FINDINGS: Diffuse coarse interstitial opacities bilaterally. Scattered irregular slightly nodular appearing opacities within the upper lobes and left lung base. No pleural effusion. Normal heart size. No pneumothorax. IMPRESSION: Diffuse coarse interstitial pulmonary opacities with scattered vague somewhat nodular appearing pulmonary opacities. Differential considerations include diffuse infection/pneumonia and metastatic disease. Chest CT could be obtained for further evaluation. Electronically Signed   By: Donavan Foil M.D.   On: 06/17/2016 18:58    EKG: Independently reviewed.No acute ST-T w changes   Assessment/Plan Active Problems:   * No active  hospital problems. *  1. Metastatic Pancreatic cancer: Pain control. support care. Hospice team consulted- goals of care - pt wants abx for presumptive sepsis for now;  PRBC transfusion ongoing per EDP.  2. Presumed Sepsis: ? Pulm source with cxr finfing of poss pneumonia Cont Abx staretd in ED. Follow Lactate level, bld/ urine Cx  3. Anemia of Chronic Disease: PRBX tx ongoing for presumed symptomatic anemia-follow clinically.  4. Dehydration with Pre-renal Azotemia: Due to poor oral intake. IVF Hydration    DVT prophylaxis: (SCD) Code Status: (DNR) Family Communication: [facility] [phone]) Disposition Plan: (Home with Hospice) Consults called: [facility]  Admission status: (inpatient / medical floor)   Arnold,Chad Suppes MD [facility] Pager [phone]  If 7PM-7AM, please contact night-coverage [email or web address] Password [facility]  06/17/2016, 10:53 PM

## 2016-06-17 NOTE — ED Notes (Signed)
Lactic Acid = 4.26, MD Pfeiffer notified

## 2016-06-17 NOTE — ED Notes (Signed)
BEAR HUGGER APPLIED 

## 2016-06-17 NOTE — ED Triage Notes (Addendum)
PT RECEIVED FROM HOME VIA EMS C/O CHRONIC ABDOMINAL PAIN AND PAINFUL URINATION X4 DAYS. PT IS ON HOSPICE, BUT STS, "THEY ARE NOT DOING NOTHING FOR ME. I'M ALWAYS IN PAIN." PT HAS A HX OF PANCREATIC CA WITH METS TO THE LIVER AND STOMACH, AND IS NOT ON ANY TREATMENT. PER EMS, HIS HR RANGES FROM 130-180, BP 102/68 NS 1L GIVEN, AND REPEAT 114/90. PT ALSO C/O LEFT FOOT PAIN ANS SWELLING DUE TO GOUT.

## 2016-06-17 NOTE — ED Notes (Signed)
Bed: [id number] Expected date: 06/17/16 Expected time: 5:24 PM Means of arrival: Ambulance Comments: Sepsis, Ca pt stage 4 tachy

## 2016-06-18 LAB — URINALYSIS, ROUTINE W REFLEX MICROSCOPIC
BILIRUBIN URINE: NEGATIVE
Glucose, UA: NEGATIVE mg/dL
KETONES UR: NEGATIVE mg/dL
Leukocytes, UA: NEGATIVE
NITRITE: NEGATIVE
PROTEIN: 30 mg/dL — AB
Specific Gravity, Urine: 1.019 (ref 1.005–1.030)
pH: 5 (ref 5.0–8.0)

## 2016-06-18 LAB — COMPREHENSIVE METABOLIC PANEL
ALBUMIN: 2.4 g/dL — AB (ref 3.5–5.0)
ALT: 68 U/L — ABNORMAL HIGH (ref 17–63)
ANION GAP: 11 (ref 5–15)
AST: 132 U/L — ABNORMAL HIGH (ref 15–41)
Alkaline Phosphatase: 678 U/L — ABNORMAL HIGH (ref 38–126)
BILIRUBIN TOTAL: 1.7 mg/dL — AB (ref 0.3–1.2)
BUN: 22 mg/dL — ABNORMAL HIGH (ref 6–20)
CHLORIDE: 99 mmol/L — AB (ref 101–111)
CO2: 21 mmol/L — ABNORMAL LOW (ref 22–32)
Calcium: 7.9 mg/dL — ABNORMAL LOW (ref 8.9–10.3)
Creatinine, Ser: 0.71 mg/dL (ref 0.61–1.24)
GFR calc Af Amer: >60 mL/min (ref >60)
GFR calc non Af Amer: >60 mL/min (ref >60)
GLUCOSE: 112 mg/dL — AB (ref 65–99)
POTASSIUM: 4.8 mmol/L (ref 3.5–5.1)
Sodium: 131 mmol/L — ABNORMAL LOW (ref 135–145)
TOTAL PROTEIN: 5.6 g/dL — AB (ref 6.5–8.1)

## 2016-06-18 LAB — GLUCOSE, CAPILLARY: Glucose-Capillary: 100 mg/dL — ABNORMAL HIGH (ref 65–99)

## 2016-06-18 LAB — CBC WITH DIFFERENTIAL/PLATELET
BASOS ABS: 0 10*3/uL (ref 0.0–0.1)
Basophils Absolute: 0 10*3/uL (ref 0.0–0.1)
Basophils Relative: 0 %
Basophils Relative: 0 %
EOS PCT: 0 %
Eosinophils Absolute: 0 10*3/uL (ref 0.0–0.7)
Eosinophils Absolute: 0 10*3/uL (ref 0.0–0.7)
Eosinophils Relative: 0 %
HEMATOCRIT: 32.9 % — AB (ref 39.0–52.0)
HEMATOCRIT: 31 % — AB (ref 39.0–52.0)
Hemoglobin: 11.1 g/dL — ABNORMAL LOW (ref 13.0–17.0)
Hemoglobin: 10.6 g/dL — ABNORMAL LOW (ref 13.0–17.0)
LYMPHS ABS: 0.9 10*3/uL (ref 0.7–4.0)
LYMPHS ABS: 0.8 10*3/uL (ref 0.7–4.0)
LYMPHS PCT: 5 %
LYMPHS PCT: 5 %
MCH: 31.2 pg (ref 26.0–34.0)
MCH: 31.6 pg (ref 26.0–34.0)
MCHC: 33.7 g/dL (ref 30.0–36.0)
MCHC: 34.2 g/dL (ref 30.0–36.0)
MCV: 92.4 fL (ref 78.0–100.0)
MCV: 92.5 fL (ref 78.0–100.0)
MONO ABS: 1.4 10*3/uL — AB (ref 0.1–1.0)
MONO ABS: 1.3 10*3/uL — AB (ref 0.1–1.0)
Monocytes Relative: 8 %
Monocytes Relative: 8 %
NEUTROS ABS: 14.9 10*3/uL — AB (ref 1.7–7.7)
NEUTROS ABS: 15 10*3/uL — AB (ref 1.7–7.7)
Neutrophils Relative %: 87 %
Neutrophils Relative %: 87 %
PLATELETS: 122 10*3/uL — AB (ref 150–400)
Platelets: 127 10*3/uL — ABNORMAL LOW (ref 150–400)
RBC: 3.56 MIL/uL — AB (ref 4.22–5.81)
RBC: 3.35 MIL/uL — AB (ref 4.22–5.81)
RDW: 18.4 % — ABNORMAL HIGH (ref 11.5–15.5)
RDW: 18.9 % — ABNORMAL HIGH (ref 11.5–15.5)
WBC: 17.2 10*3/uL — AB (ref 4.0–10.5)
WBC: 17.2 10*3/uL — AB (ref 4.0–10.5)

## 2016-06-18 LAB — TROPONIN I
TROPONIN I: 0.08 ng/mL — AB (ref <0.03)
Troponin I: 0.06 ng/mL (ref <0.03)

## 2016-06-18 LAB — MRSA PCR SCREENING: MRSA BY PCR: NEGATIVE

## 2016-06-18 LAB — EXPECTORATED SPUTUM ASSESSMENT W REFEX TO RESP CULTURE

## 2016-06-18 LAB — BRAIN NATRIURETIC PEPTIDE: B NATRIURETIC PEPTIDE 5: 302.9 pg/mL — AB (ref 0.0–100.0)

## 2016-06-18 LAB — EXPECTORATED SPUTUM ASSESSMENT W GRAM STAIN, RFLX TO RESP C

## 2016-06-18 MED ORDER — ENSURE ENLIVE PO LIQD
237.0000 mL | Freq: Three times a day (TID) | ORAL | Status: DC
  Administered 2016-06-18: 237 mL via ORAL

## 2016-06-18 MED ORDER — DIPHENHYDRAMINE HCL 50 MG/ML IJ SOLN: 12.5000 mg | Freq: Four times a day (QID) | INTRAMUSCULAR | Status: DC | PRN

## 2016-06-18 MED ORDER — NALOXONE HCL 0.4 MG/ML IJ SOLN: 0.4000 mg | INTRAMUSCULAR | Status: DC | PRN

## 2016-06-18 MED ORDER — ONDANSETRON HCL 4 MG PO TABS: 4.0000 mg | ORAL_TABLET | Freq: Four times a day (QID) | ORAL | Status: DC | PRN

## 2016-06-18 MED ORDER — HYDROMORPHONE 1 MG/ML IV SOLN
INTRAVENOUS | Status: AC
  Administered 2016-06-18: 3 mg via INTRAVENOUS
  Administered 2016-06-18: 02:00:00 via INTRAVENOUS
  Administered 2016-06-18: 1 mg via INTRAVENOUS
  Administered 2016-06-18: 2 mg via INTRAVENOUS
  Filled 2016-06-18: qty 25

## 2016-06-18 MED ORDER — POLYETHYLENE GLYCOL 3350 17 G PO PACK: 17.0000 g | PACK | Freq: Every day | ORAL | Status: DC

## 2016-06-18 MED ORDER — DOCUSATE SODIUM 100 MG PO CAPS: 100.0000 mg | ORAL_CAPSULE | Freq: Every day | ORAL | Status: DC | PRN

## 2016-06-18 MED ORDER — NICOTINE 14 MG/24HR TD PT24
14.0000 mg | MEDICATED_PATCH | Freq: Every day | TRANSDERMAL | Status: DC
  Administered 2016-06-18 – 2016-06-19 (×2): 14 mg via TRANSDERMAL
  Filled 2016-06-18 (×2): qty 1

## 2016-06-18 MED ORDER — METHADONE HCL 5 MG PO TABS
7.5000 mg | ORAL_TABLET | Freq: Three times a day (TID) | ORAL | Status: DC
  Administered 2016-06-18 – 2016-06-19 (×5): 7.5 mg via ORAL
  Filled 2016-06-18 (×5): qty 2

## 2016-06-18 MED ORDER — INFLUENZA VAC SPLIT QUAD 0.5 ML IM SUSY: 0.5000 mL | PREFILLED_SYRINGE | INTRAMUSCULAR | Status: DC

## 2016-06-18 MED ORDER — SODIUM CHLORIDE 0.9% FLUSH: 9.0000 mL | INTRAVENOUS | Status: DC | PRN

## 2016-06-18 MED ORDER — MORPHINE SULFATE 15 MG PO TABS
7.5000 mg | ORAL_TABLET | ORAL | Status: DC | PRN
  Administered 2016-06-18 – 2016-06-19 (×3): 15 mg via ORAL
  Filled 2016-06-18 (×3): qty 1

## 2016-06-18 MED ORDER — PNEUMOCOCCAL VAC POLYVALENT 25 MCG/0.5ML IJ INJ
0.5000 mL | INJECTION | INTRAMUSCULAR | Status: DC
  Filled 2016-06-18: qty 0.5

## 2016-06-18 MED ORDER — IBUPROFEN 200 MG PO TABS: 600.0000 mg | ORAL_TABLET | Freq: Every day | ORAL | Status: DC | PRN

## 2016-06-18 MED ORDER — SODIUM CHLORIDE 0.9 % IV SOLN
INTRAVENOUS | Status: DC
  Administered 2016-06-18: 125 mL/h via INTRAVENOUS
  Administered 2016-06-18 – 2016-06-19 (×2): via INTRAVENOUS

## 2016-06-18 MED ORDER — ONDANSETRON HCL 4 MG/2ML IJ SOLN: 4.0000 mg | Freq: Four times a day (QID) | INTRAMUSCULAR | Status: DC | PRN

## 2016-06-18 MED ORDER — DIPHENHYDRAMINE HCL 12.5 MG/5ML PO ELIX: 12.5000 mg | ORAL_SOLUTION | Freq: Four times a day (QID) | ORAL | Status: DC | PRN

## 2016-06-18 NOTE — Progress Notes (Signed)
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 to liver diagnosed 02/10/2016 -confirmed by [facility] 04/15/2016 Heavy smoker, heavy drinker  Felt to be a poor candidate for chemotherapy secondary to side effects infection and at recent office visit 05/01/2016 recommended hospice care--is on morphine every 4 for pain Schizoaffective disease  Admitted with reported sepsis Volume depletion BUN/creatinine 29/1.02 up from 7.8/0.6 in January Elevated LFTs AST 111, alkaline phosphatase 748 WBC 17.7 baseline 11 Hemoglobin 15.4-->8 0.2 Hyponatremia 130 Lactic acidosis 4.2--2.8 with rehydration Chest x-ray on admission showed diffuse interstitial pulmonary opacities DDX pneumonia versus metastatic disease  states has been feeling weak more winded has lost weight and has lost appetite.      Assessment & Plan:   Active Problems:   Sepsis ([place])     Possible pneumonia versus metastatic disease Continue vancomycin and Zosyn, repeat CBC  this morning. I do not think he is septic and I think he has metastatic disease to the lungs and we will de-escalate antibiotic He has a very poor overall prognosis-Will discuss with patient imaging and other issues but hospice to discuss and delineate further O's of care  Metastatic pancreatic cancer stage IVand it it for hospice, not chemotherapy as per discussion in January with his oncologist Dr. Burr Medico his disease has had  progression--I have explained to him we will be cutting off the PCA and hospice is to see him. We will place him back on his home medications later on today  his liver enzymes are elevated and this probably represents spread to the liver  Chronic ethanolism-monitorCIWA score and treat  if needed  Acute kidney injury-PN/creatinine still elevated from baseline 7./0.6-repeat labs a.m. Home situation-Social work input needed to verify  Elevated troponin, proBNP, lactic acid-these are all reactive joints that can the elevated in setting of cancer. 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 We will transition to home medication  Anemia secondary to pancreatic cancer versus alcoholism-macrocytic anemia- Transfusion has been ordered. Monitor--given his disease assess a do not think he will be a good candidate for colonoscopy and endoscopy and this may be life limiting illness that he has. He tells me he is had 3 dark stools at home and Hemoccult was positive  Thromboangiitis obliterans-darkening of toes. I asked patient if he is willing to quit smoking and he says "with this cancer here taking way what I would really enjoy".  I do not think this is gout and we will hold off on colchicine for now and revisit       Consultants:   None--did notify oncologist with note  Procedures:   None  Antimicrobials:   Vancomycin 2/18  Zosyn 2/18    Subjective:  States that the pain is 6 on 10 in the abdomen and the PCA seems to work to some extent. He has not had any fever or chills or any productive sputum overall just feels malaise No falls Has me that he has "gout" in the left great toe and right fourth toe and that the leg looked swollen previously  Objective:  Vitals:   06/18/16 0330 06/18/16 0456 06/18/16 0525 06/18/16 0856  BP: 128/76  (!) 143/74   Pulse: 82  79   Resp: 18 17 20 20   Temp: 97.7 F (36.5 C)  97.8 F (36.6 C)   TempSrc: Oral  Oral   SpO2: 98% 97% 95% 94%  Weight:      Height:        Intake/Output Summary (Last 24 hours) at 06/18/16 1009 Last data filed at  06/18/16 0600  Gross per 24 hour  Intake          4353.33 ml  Output              350 ml  Net          4003.33 ml   Filed Weights   06/17/16 1801 06/18/16 0120  Weight: 59 kg (130 lb) 56 kg (123 lb 7.3 oz)    Examination:  General exam: Appears calm and comfortable On PCA and nasal cannula Respiratory system: Clear to auscultation.  Cardiovascular system: S1 & S2 heard, RRR. No JVD, murmurs, rubs, gallops or clicks. No pedal edema. Gastrointestinal system: Abdomen is nondistended, tender in the epigastrium Central nervous system: Alert and oriented. No focal neurological deficits. Extremities: Symmetric 5 x 5 power. Skin: No rashes, lesions or ulcers Psychiatry: Judgement and insight appear normal. Mood & affect appropriate.     Data Reviewed: I have personally reviewed following labs and imaging studies  CBC:  Recent Labs Lab 06/17/16 1818 06/18/16 0819  WBC 17.7* 17.2*  NEUTROABS 15.3* 14.9*  HGB 8.2* 11.1*  HCT 23.5* 32.9*  MCV 98.3 92.4  PLT 162 [id number]*   Basic Metabolic Panel:  Recent Labs Lab 06/17/16 1818 06/18/16 0819  NA 130* 131*  K 4.2 4.8  CL 95* 99*  CO2 21* 21*  GLUCOSE 125* 112*  BUN 29* 22*  CREATININE 1.02 0.71  CALCIUM 8.2* 7.9*   GFR: Estimated Creatinine Clearance: 76.8 mL/min (by C-G formula based on SCr of 0.71 mg/dL). Liver Function Tests:  Recent Labs Lab 06/17/16 1818 06/18/16 0819  AST 111* 132*  ALT 65* 68*  ALKPHOS 748* 678*  BILITOT 1.2 1.7*  PROT 5.7* 5.6*  ALBUMIN 2.6* 2.4*   No results for input(s): LIPASE, AMYLASE in the last 168 hours. No results for input(s): AMMONIA in the last 168 hours. Coagulation Profile:  Recent Labs Lab 06/17/16 1818  INR 1.30   Cardiac Enzymes:  Recent Labs Lab 06/18/16 0819  TROPONINI 0.08*   BNP (last 3 results) No results for input(s): PROBNP in the last 8760 hours. HbA1C: No results for input(s): HGBA1C in the last 72 hours. CBG:  Recent Labs Lab 06/18/16 0842    GLUCAP 100*   Lipid Profile: No results for input(s): CHOL, HDL, LDLCALC, TRIG, CHOLHDL, LDLDIRECT in the last 72 hours. Thyroid Function Tests: No results for input(s): TSH, T4TOTAL, FREET4, T3FREE, THYROIDAB in the last 72 hours. Anemia Panel: No results for input(s): VITAMINB12, FOLATE, FERRITIN, TIBC, IRON, RETICCTPCT in the last 72 hours. Urine analysis:    Component Value Date/Time   COLORURINE YELLOW 06/18/2016 0542   APPEARANCEUR CLEAR 06/18/2016 0542   LABSPEC 1.019 06/18/2016 0542   PHURINE 5.0 06/18/2016 0542   GLUCOSEU NEGATIVE 06/18/2016 0542   HGBUR SMALL (A) 06/18/2016 0542   BILIRUBINUR NEGATIVE 06/18/2016 0542   KETONESUR NEGATIVE 06/18/2016 0542   PROTEINUR 30 (A) 06/18/2016 0542   NITRITE NEGATIVE 06/18/2016 0542   LEUKOCYTESUR NEGATIVE 06/18/2016 0542   Sepsis Labs: @LABRCNTIP (procalcitonin:4,lacticidven:4)  )  Recent Results (from the past 240 hour(s))  Culture, blood (Routine x 2)     Status: None (Preliminary result)   Collection Time: 06/17/16  6:15 PM  Result Value Ref Range Status   Specimen Description BLOOD LEFT ARM  Final   Special Requests BOTTLES DRAWN AEROBIC AND ANAEROBIC 5ML  Final   Culture PENDING  Incomplete   Report Status PENDING  Incomplete  Culture, blood (Routine x 2)     Status: None (Preliminary result)   Collection Time: 06/17/16  6:20 PM  Result Value Ref Range Status   Specimen Description BLOOD RIGHT ARM  Final   Special Requests IN PEDIATRIC BOTTLE 3ML  Final   Culture PENDING  Incomplete   Report Status PENDING  Incomplete  MRSA PCR Screening     Status: None   Collection Time: 06/18/16  4:30 AM  Result Value Ref Range Status   MRSA by PCR NEGATIVE NEGATIVE Final    Comment:        The GeneXpert MRSA Assay (FDA approved for NASAL specimens only), is one component of a comprehensive MRSA colonization surveillance program. It is not intended to diagnose MRSA infection nor to guide or monitor treatment for MRSA  infections.   Culture, sputum-assessment     Status: None   Collection Time: 06/18/16  5:42 AM  Result Value Ref Range Status   Specimen Description SPUTUM  Final   Special Requests NONE  Final   Sputum evaluation THIS SPECIMEN IS ACCEPTABLE FOR SPUTUM CULTURE  Final   Report Status 06/18/2016 FINAL  Final         Radiology Studies: Dg Chest 2 View  Result Date: 06/17/2016 CLINICAL DATA:  Foot pain stomach pain and weakness for 4 days history of pancreas cancer with metastatic disease to liver and stomach EXAM: CHEST  2 VIEW COMPARISON:  CT 02/09/2006 FINDINGS: Diffuse coarse interstitial opacities bilaterally. Scattered irregular slightly nodular appearing opacities within the upper lobes and left lung base. No pleural effusion. Normal heart size. No pneumothorax. IMPRESSION: Diffuse coarse interstitial pulmonary opacities with scattered vague somewhat nodular appearing pulmonary opacities. Differential considerations include diffuse infection/pneumonia and metastatic disease. Chest CT could be obtained for further evaluation. Electronically Signed   By: Donavan Foil M.D.   On: 06/17/2016 18:58        Scheduled Meds: . HYDROmorphone   Intravenous Q4H  . [START ON 06/19/2016] Influenza vac split quadrivalent PF  0.5 mL Intramuscular Tomorrow-1000  . methadone  7.5 mg Oral TID  . piperacillin-tazobactam (ZOSYN)  IV  3.375 g Intravenous Q8H  . [START ON 06/19/2016] pneumococcal 23 valent vaccine  0.5 mL Intramuscular Tomorrow-1000  . polyethylene glycol  17 g Oral Daily  . vancomycin  750 mg Intravenous BID   Continuous Infusions: . sodium chloride 125 mL/hr (06/18/16 0221)     LOS: 1 day    Time spent: [place], MD [facility]   If 7PM-7AM, please contact night-coverage [email or web address] Password [email or web address] 06/18/2016, 10:09 AM

## 2016-06-18 NOTE — Progress Notes (Signed)
Home Care SW and RN made visit to Patient.  No family present but visitors have been coming in and out during the day.  Patient talked about how he came to the hospital and SW and RN spent time talking about what he was going to do when he went home and talked to him about options.  SW will continue to follow and assist with discharge planning as needed.  The plan is for Patient to return home at time of discharge.  Albertina Senegal, LCSW [facility] [place] [phone]

## 2016-06-18 NOTE — Progress Notes (Signed)
[facility] Liaison RN visit   This a GIP, related and covered hospital admission of 06/18/2016 with [facility] DX of Metastatic Pancreatic Cancer to Liver per Dr. Tomasa Hosteller.  Patient was transferred to [facility] from home via ambulance with complaints of Abdominal pain, painful urination, SOB and decreased appetite.  Patient admitted to hospital for Sepsis.    During RN visit, patient found sleeping quietly with RR regular/even on 3.5L O2 via Nasal Cannula  in bed with HOB at 45 degrees with no noted distress or s/s of discomfort.  Empty cereal container noted on bedside tray. No visitors at side at this time.  Bedside RN stated "patient sleeping comfortable since arrival and plan to discontinue PCA today and possible discharge in morning".  Patient on Hydromorphone 1mg /ml PCA IV with a current bolus dose of 0.5 mg with a 35min lockout and 2mg /hour dose limit, IV Zosyn 3.375 g Q8hr, IV Vancomycin 750mg /141ml q12hr, 0.9% NS at 167ml/hr via perpheral IV site  and 1 prn 1mg  dose of Dilaudid at 0054 for moderate pain.  Tolerating PO with 480 intake in since admission.    Hospice will continue to follow daily.  Gar Ponto, RN [facility] Liaison [phone]  [facility] Liaisons are now on AMION.

## 2016-06-18 NOTE — Care Management Note (Signed)
Case Management Note  Patient Details  Name: Chad Arnold MRN: [id number] Date of Birth: 02-25-1956  Subjective/Objective: 61 y/o m admitted w/Sepsis. Hx: Pancreatic Ca, DNR. Active w/[facility]. Patient wants to return home w/[facility] services.                 Action/Plan:d/c plan home w/[facility]   Expected Discharge Date:                  Expected Discharge Plan:  Home w Hospice Care  In-House Referral:     Discharge planning Services  CM Consult  Post Acute Care Choice:  Hospice (Active w/[facility]) Choice offered to:     DME Arranged:    DME Agency:     HH Arranged:    HH Agency:     Status of Service:  In process, will continue to follow  If discussed at Long Length of Stay Meetings, dates discussed:    Additional Comments:  Dessa Phi, RN 06/18/2016, 12:16 PM

## 2016-06-18 NOTE — Progress Notes (Signed)
Initial Nutrition Assessment  DOCUMENTATION CODES:   Underweight, Non-severe (moderate) malnutrition in context of chronic illness  INTERVENTION:   -Ensure Enlive po TID, each supplement provides 350 kcal and 20 grams of protein  NUTRITION DIAGNOSIS:   Malnutrition related to chronic illness as evidenced by mild depletion of body fat, mild depletion of muscle mass, percent weight loss.  GOAL:   Patient will meet greater than or equal to 90% of their needs  MONITOR:   PO intake, Supplement acceptance, Labs, Weight trends, Skin, I & O's  REASON FOR ASSESSMENT:   Malnutrition Screening Tool, Consult  (malnutrition)  ASSESSMENT:   Chad Arnold is a 61 y.o. male with medical history significant for metastatic pancreatic cancer, currently being followed by hospice at home.  He presented with 2-3 days history of progressively worsening sob with generalised weakness associated with cough productive of yellowish sputum, without fever or chills, and worsening generalised abdominal pains despite morphine tx without nausea or vomiting but his oral intake had been diminished. No melena or hematochezia.  Pt admitted with metastatic pancreatic cancer.   Pt sleeping soundly at time of visit. No family or caregivers present to provide further hx.   Case discussed with RN, who requested pt not be awoken at this time, as pt has just gotten his pain controlled with his PCA pump. She shares that pt is followed by hospice at home. She confirms that pt ate very little at breakfast this morning and plans to give pt Ensure. Hospice care team has already evaluated this morning.   Abbreviated Nutrition-Focused physical exam completed. Findings are mild to moderate fat depletion and mild to moderate muscle depletion.   Reviewed wt hx, which reveals pt has experienced a 16.8% wt loss over the past 3 months, which is significant for time frame given.   Labs reviewed: Na: 131, CBGS: 100.   Diet Order:   Diet regular Room service appropriate? Yes; Fluid consistency: Thin  Skin:  Wound (see comment) (pressure injury on sacrum)  Last BM:  06/16/16  Height:   Ht Readings from Last 1 Encounters:  06/17/16 5\' 11"  (1.803 m)    Weight:   Wt Readings from Last 1 Encounters:  06/18/16 123 lb 7.3 oz (56 kg)    Ideal Body Weight:  78.2 kg  BMI:  Body mass index is 17.22 kg/m.  Estimated Nutritional Needs:   Kcal:  1600-1800  Protein:  75-90 grams  Fluid:  1.6-1.8 L  EDUCATION NEEDS:   No education needs identified at this time  Ayslin Kundert A. Jimmye Norman, RD, LDN, CDE Pager: [phone] After hours Pager: [phone]

## 2016-06-18 NOTE — Progress Notes (Signed)
CRITICAL VALUE STICKER  CRITICAL VALUE:  RECEIVER B.Aaryan Essman, RN  DATE & TIME NOTIFIED:  9:40 AM 06/18/2016   MESSENGER : unknown  MD NOTIFIED: [email or web address]   TIME OF NOTIFICATION: 9:40 AM   RESPONSE: 9:40 AM

## 2016-06-19 LAB — COMPREHENSIVE METABOLIC PANEL
ALK PHOS: 685 U/L — AB (ref 38–126)
ALT: 59 U/L (ref 17–63)
AST: 103 U/L — ABNORMAL HIGH (ref 15–41)
Albumin: 2 g/dL — ABNORMAL LOW (ref 3.5–5.0)
Anion gap: 7 (ref 5–15)
BILIRUBIN TOTAL: 2.4 mg/dL — AB (ref 0.3–1.2)
BUN: 20 mg/dL (ref 6–20)
CALCIUM: 7.4 mg/dL — AB (ref 8.9–10.3)
CO2: 21 mmol/L — ABNORMAL LOW (ref 22–32)
CREATININE: 0.56 mg/dL — AB (ref 0.61–1.24)
Chloride: 104 mmol/L (ref 101–111)
Glucose, Bld: 124 mg/dL — ABNORMAL HIGH (ref 65–99)
Potassium: 4 mmol/L (ref 3.5–5.1)
Sodium: 132 mmol/L — ABNORMAL LOW (ref 135–145)
Total Protein: 4.4 g/dL — ABNORMAL LOW (ref 6.5–8.1)

## 2016-06-19 LAB — URINE CULTURE: Culture: NO GROWTH

## 2016-06-19 LAB — GLUCOSE, CAPILLARY: Glucose-Capillary: 137 mg/dL — ABNORMAL HIGH (ref 65–99)

## 2016-06-19 LAB — TYPE AND SCREEN
BLOOD PRODUCT EXPIRATION DATE: 201803072359
Blood Product Expiration Date: 201803072359
ISSUE DATE / TIME: 201802182218
ISSUE DATE / TIME: 201802190251
UNIT TYPE AND RH: 600
Unit Type and Rh: 600

## 2016-06-19 LAB — HIV ANTIBODY (ROUTINE TESTING W REFLEX): HIV Screen 4th Generation wRfx: NONREACTIVE

## 2016-06-19 MED ORDER — DILTIAZEM HCL 25 MG/5ML IV SOLN
10.0000 mg | Freq: Once | INTRAVENOUS | Status: AC
  Administered 2016-06-19: 10 mg via INTRAVENOUS
  Filled 2016-06-19: qty 5

## 2016-06-19 MED ORDER — DILTIAZEM HCL 100 MG IV SOLR
10.0000 mg/h | INTRAVENOUS | Status: DC
  Administered 2016-06-19: 10 mg/h via INTRAVENOUS
  Filled 2016-06-19: qty 100

## 2016-06-19 NOTE — Progress Notes (Signed)
Home Care SW stopped by to see Patient this morning.  He is getting ready to go home today.  He is trying to find someone to be at his apartment to get the equipment he needs before he leaves the hospital.  SW offered support and will follow up tomorrow with Home Care RN.  [place], LCSW [phone] [facility] [place]

## 2016-06-19 NOTE — Care Management Note (Signed)
Case Management Note  Patient Details  Name: Chad Arnold MRN: [id number] Date of Birth: Jan 22, 1956  Subjective/Objective: d/c home w/[facility] services(already active with)-rep Marzetta Board already following-aware of d/c today. DME needed:02,shower chair,w/c. Hospital bed to be determined once @ home since it may not fit.DNR form, [facility],(forms on shadow chart,private sitter list) to be called for ambulance transport @ d/c-Nsg aware to call when all dme is set up in the home-[facility] will manage dme & coordinate w/nurse once dme in home-Nsg voiced understanding. [facility] will have home nurse to make home visit, likely in [place] worker, & aide in am. Patient has friend who he has contacted to be available to check on him @ home-Eva [place].Patient voiced understanding.No further CM needs.                  Action/Plan:d/c home w/Hospice/dme   Expected Discharge Date:  06/19/16               Expected Discharge Plan:  Home w Hospice Care  In-House Referral:     Discharge planning Services  CM Consult  Post Acute Care Choice:  Hospice (Active w/[facility]) Choice offered to:     DME Arranged:    DME Agency:     HH Arranged:    HH Agency:     Status of Service:  Completed, signed off  If discussed at [employer] of Stay Meetings, dates discussed:    Additional Comments:  Dessa Phi, RN 06/19/2016, 11:03 AM

## 2016-06-19 NOTE — Progress Notes (Signed)
CSW received consult for homeless issues. CSW confirmed with [facility] SW, [place] that patient is no longer homeless as they have received a housing voucher.   No further CSW needs identified - CSW signing off.   Raynaldo Opitz, [facility] Clinical Social Worker cell #: [phone]

## 2016-06-19 NOTE — Discharge Summary (Addendum)
Physician Discharge Summary  Chad Arnold [id number] DOB: 05/08/1955 DOA: 06/17/2016  PCP: [name], FNP  Admit date: 06/17/2016 Discharge date: 06/19/2016  Time spent: 30 minutes  Recommendations for Outpatient Follow-up:  1. Patient has a very poor overall short-term prognosis hospice should be engaged with home health care as well as hospice aide should come out and delineate goals and I would recommend DO not hospitalize 2. Symptomatic management at home for end-of-life  Discharge Diagnoses:  Active Problems:   Sepsis [place])   Discharge Condition: Guarded  Diet recommendation: Liberalize  Filed Weights   06/17/16 1801 06/18/16 0120  Weight: 59 kg (130 lb) 56 kg (123 lb 7.3 oz)    History of present illness:   61 year old male Previous homelessness until summer 2017-recently moved into a new apartment about Pancreatic cancer stage 4 (T3 N0 M1) with metastases to liver diagnosed 02/10/2016 -confirmed by [facility] 04/15/2016 Heavy smoker, heavy drinker Moderately malnourished             Felt to be a poor candidate for chemotherapy secondary to side effects infection and at recent office visit 05/01/2016 recommended hospice care--is on morphine every 4 for pain Schizoaffective disease It was notfelt that he was Septic on admission but rather he had metastatic disease in the lungs  Admitted with reported sepsis Volume depletion BUN/creatinine 29/1.02 up from 7.8/0.6 in January Elevated LFTs AST 111, alkaline phosphatase 748 WBC 17.7 baseline 11 Hemoglobin 15.4-->8 0.2 Hyponatremia 130 Lactic acidosis 4.2--2.8 with rehydration Chest x-ray on admission showed diffuse interstitial pulmonary opacities DDX pneumonia versus metastatic disease  states has been feeling weak more winded has lost weight and has lost appetite.  Patient was kept in the hospital now was felt that patient was doing poorly he had dropped his hemoglobin and was hyponatremic and dehydrated. I had a  discussion with the patient telling him that this is essentially end-of-life care and that he should reconsider coming to the hospital as what we do in the hospital would not really change ongoing poor prognosis He also went to A. fib RVR and I do not think that treatment would change his overall prognosis going for As been recommended to follow-up with home hospice and they should reengage with him as they have an involved in his care and saw him in the hospital-he may need supplies including oxygen and once again we would attempt to not hospitalize but manage his care at home  Discharge Exam: Vitals:   06/19/16 0637 06/19/16 0805  BP: 94/76   Pulse: (!) 150 99  Resp:    Temp:      General: Alert short of breath. Cachexia noted Cardiovascular: S1-S2 no murmur rub or gallop tachycardic 130s Respiratory: No wheeze no rales no rhonchi  Discharge Instructions   Discharge Instructions    Diet - low sodium heart healthy    Complete by:  As directed    Increase activity slowly    Complete by:  As directed      Current Discharge Medication List    CONTINUE these medications which have NOT CHANGED   Details  [employer] SENNA PLUS 8.6-50 MG tablet Take 2 tablets by mouth 2 (two) times daily as needed for mild constipation.  Refills: 2    docusate sodium (COLACE) 100 MG capsule Take 100 mg by mouth daily as needed for mild constipation.    ibuprofen (ADVIL,MOTRIN) 200 MG tablet Take 600 mg by mouth daily as needed for moderate pain.     methadone (  DOLOPHINE) 5 MG tablet Take 1.5 tablets by mouth 3 (three) times daily. Refills: 0    morphine (MSIR) 15 MG tablet Take 0.5-1 tablets (7.5-15 mg total) by mouth every 4 (four) hours as needed for severe pain. Qty: 30 tablet, Refills: 0    HYDROcodone-acetaminophen (NORCO) 10-325 MG tablet Take 1-2 tablets by mouth every 6 (six) hours as needed for moderate pain or severe pain (for pain). Qty: 50 tablet, Refills: 0    polyethylene glycol  (MIRALAX) packet Take 17 g by mouth daily. Refills: 0   Associated Diagnoses: Malignant neoplasm of tail of pancreas ([place])       No Known Allergies Follow-up Information    [facility] Follow up.   Specialty:  Hospice and Palliative Medicine Why:  home nurse. Contact information: [place] [address] [phone]            The results of significant diagnostics from this hospitalization (including imaging, microbiology, ancillary and laboratory) are listed below for reference.    Significant Diagnostic Studies: Dg Chest 2 View  Result Date: 06/17/2016 CLINICAL DATA:  Foot pain stomach pain and weakness for 4 days history of pancreas cancer with metastatic disease to liver and stomach EXAM: CHEST  2 VIEW COMPARISON:  CT 02/09/2006 FINDINGS: Diffuse coarse interstitial opacities bilaterally. Scattered irregular slightly nodular appearing opacities within the upper lobes and left lung base. No pleural effusion. Normal heart size. No pneumothorax. IMPRESSION: Diffuse coarse interstitial pulmonary opacities with scattered vague somewhat nodular appearing pulmonary opacities. Differential considerations include diffuse infection/pneumonia and metastatic disease. Chest CT could be obtained for further evaluation. Electronically Signed   By: Donavan Foil M.D.   On: 06/17/2016 18:58    Microbiology: Recent Results (from the past 240 hour(s))  Culture, blood (Routine x 2)     Status: None (Preliminary result)   Collection Time: 06/17/16  6:15 PM  Result Value Ref Range Status   Specimen Description BLOOD LEFT ARM  Final   Special Requests BOTTLES DRAWN AEROBIC AND ANAEROBIC 5ML  Final   Culture PENDING  Incomplete   Report Status PENDING  Incomplete  Culture, blood (Routine x 2)     Status: None (Preliminary result)   Collection Time: 06/17/16  6:20 PM  Result Value Ref Range Status   Specimen Description BLOOD RIGHT ARM  Final   Special Requests IN  PEDIATRIC BOTTLE 3ML  Final   Culture   Final    NO GROWTH < 24 HOURS Performed at [facility], [place] [address] [place], [place] [address]    Report Status PENDING  Incomplete  MRSA PCR Screening     Status: None   Collection Time: 06/18/16  4:30 AM  Result Value Ref Range Status   MRSA by PCR NEGATIVE NEGATIVE Final    Comment:        The GeneXpert MRSA Assay (FDA approved for NASAL specimens only), is one component of a comprehensive MRSA colonization surveillance program. It is not intended to diagnose MRSA infection nor to guide or monitor treatment for MRSA infections.   Urine culture     Status: None   Collection Time: 06/18/16  5:42 AM  Result Value Ref Range Status   Specimen Description URINE, CLEAN CATCH  Final   Special Requests NONE  Final   Culture   Final    NO GROWTH Performed at [facility], [address], [place],  03/20/1956    Report Status 06/19/2016 FINAL  Final  Culture, sputum-assessment     Status: None   Collection Time: 06/18/16  5:42 AM  Result Value Ref Range Status   Specimen Description SPUTUM  Final   Special Requests NONE  Final   Sputum evaluation THIS SPECIMEN IS ACCEPTABLE FOR SPUTUM CULTURE  Final   Report Status 06/18/2016 FINAL  Final  Culture, respiratory (NON-Expectorated)     Status: None (Preliminary result)   Collection Time: 06/18/16  5:42 AM  Result Value Ref Range Status   Specimen Description SPUTUM  Final   Special Requests NONE Reflexed from [id number]  Final   Gram Stain   Final    ABUNDANT WBC PRESENT,BOTH PMN AND MONONUCLEAR RARE SQUAMOUS EPITHELIAL CELLS PRESENT FEW GRAM POSITIVE RODS RARE GRAM POSITIVE COCCI IN PAIRS RARE GRAM NEGATIVE RODS RARE GRAM NEGATIVE COCCI IN PAIRS    Culture   Final    CULTURE REINCUBATED FOR BETTER GROWTH Performed at [facility], [place] [address] [place], [place] [address]    Report Status PENDING  Incomplete     Labs: Basic Metabolic Panel:  Recent  Labs Lab 06/17/16 1818 06/18/16 0819 06/19/16 0534  NA 130* 131* 132*  K 4.2 4.8 4.0  CL 95* 99* 104  CO2 21* 21* 21*  GLUCOSE 125* 112* 124*  BUN 29* 22* 20  CREATININE 1.02 0.71 0.56*  CALCIUM 8.2* 7.9* 7.4*   Liver Function Tests:  Recent Labs Lab 06/17/16 1818 06/18/16 0819 06/19/16 0534  AST 111* 132* 103*  ALT 65* 68* 59  ALKPHOS 748* 678* 685*  BILITOT 1.2 1.7* 2.4*  PROT 5.7* 5.6* 4.4*  ALBUMIN 2.6* 2.4* 2.0*   No results for input(s): LIPASE, AMYLASE in the last 168 hours. No results for input(s): AMMONIA in the last 168 hours. CBC:  Recent Labs Lab 06/17/16 1818 06/18/16 0819 06/18/16 1115  WBC 17.7* 17.2* 17.2*  NEUTROABS 15.3* 14.9* 15.0*  HGB 8.2* 11.1* 10.6*  HCT 23.5* 32.9* 31.0*  MCV 98.3 92.4 92.5  PLT 162 127* 122*   Cardiac Enzymes:  Recent Labs Lab 06/18/16 0819 06/18/16 1348  TROPONINI 0.08* 0.06*   BNP: BNP (last 3 results)  Recent Labs  06/18/16 0819  BNP 302.9*    ProBNP (last 3 results) No results for input(s): PROBNP in the last 8760 hours.  CBG:  Recent Labs Lab 06/18/16 0842 06/19/16 0730  GLUCAP 100* 137*       Signed:  Nita Sells MD   [facility] 06/19/2016, 10:02 AM

## 2016-06-19 NOTE — Progress Notes (Addendum)
This morning at 0430, patient sat up on the side of the bed to urinate and patient's HR went up into the 170's sustaining.  BP 110/63. EKG done and it showed a fib with RVR. On call notified and new orders were given to give IV cardizem push 10mg  once. RN gave this dose and 30 minutes later, patient's HR was still going up into the 150's-160's. BP WNL. On call notified and new orders were given to start a cardizem drip at 10mg /hr. Drip started at 0610.

## 2016-06-20 LAB — CULTURE, RESPIRATORY W GRAM STAIN: Culture: NORMAL

## 2016-06-22 LAB — CULTURE, BLOOD (ROUTINE X 2): Culture: NO GROWTH

## 2016-06-23 LAB — CULTURE, BLOOD (ROUTINE X 2): Culture: NO GROWTH

## 2016-06-25 ENCOUNTER — Telehealth: Payer: Self-pay | Admitting: Hematology

## 2016-06-25 NOTE — Telephone Encounter (Signed)
Horris Latino called form hospice to let you know that Chad Arnold passed away 2016/07/23 at 4:23 pm @ home

## 2016-06-28 DEATH — deceased

## 2017-12-28 IMAGING — CR DG CHEST 2V
2 series · 2 of 2 positions shown · non-contrast
Comparison: CT 02/09/2006

CLINICAL DATA: Foot pain stomach pain and weakness for 4 days
history of pancreas cancer with metastatic disease to liver and
stomach

EXAM:
CHEST  2 VIEW

[w chest lat]
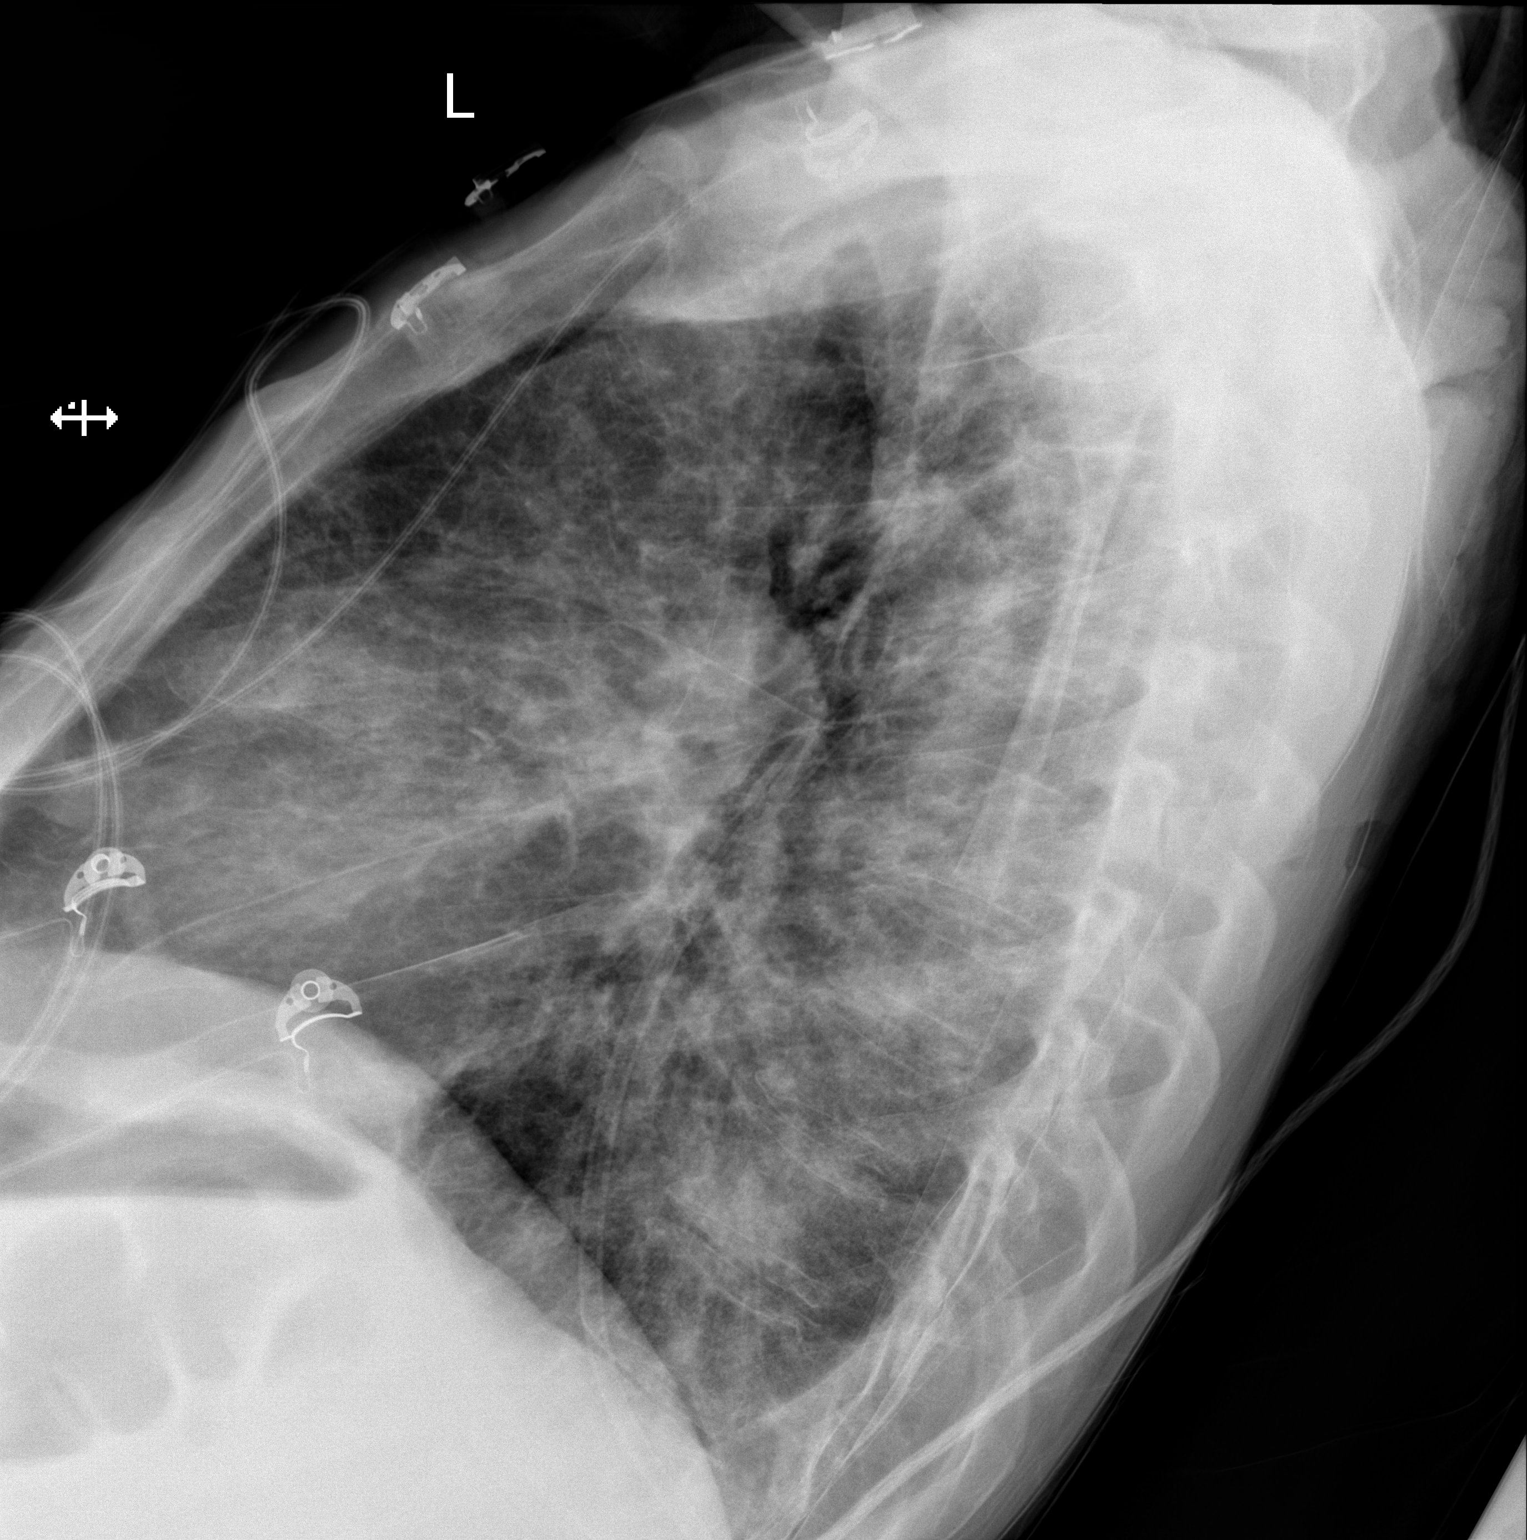

[x chest ap]
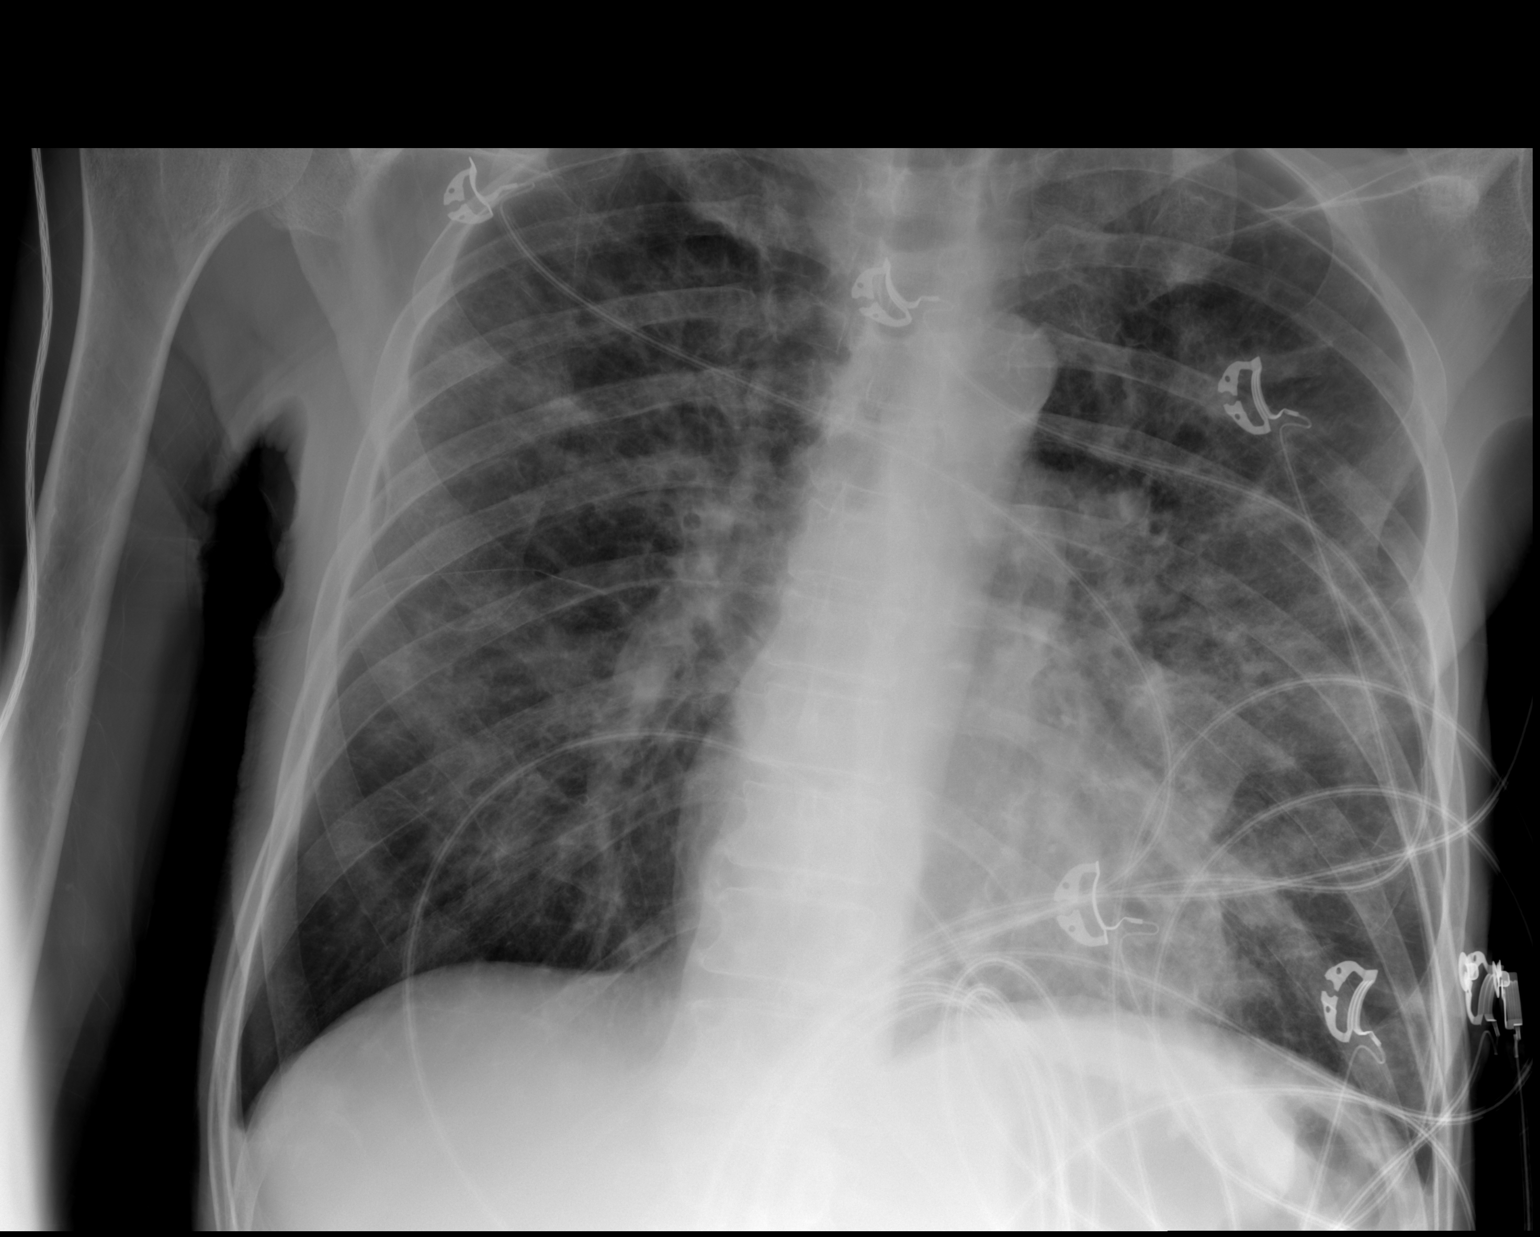

[2 of 2 positions shown; findings below may reference images not displayed]

FINDINGS: Diffuse coarse interstitial opacities bilaterally. Scattered
irregular slightly nodular appearing opacities within the upper
lobes and left lung base. No pleural effusion. Normal heart size. No
pneumothorax.
IMPRESSION: Diffuse coarse interstitial pulmonary opacities with scattered vague
somewhat nodular appearing pulmonary opacities. Differential
considerations include diffuse infection/pneumonia and metastatic
disease. Chest CT could be obtained for further evaluation.
